# Patient Record
Sex: Female | Born: 1965 | Race: White | Marital: Married | Smoking: Never smoker
Health system: Western US, Academic
[De-identification: ages and names within clinical notes are randomized; demographics above are authoritative.]

## PROBLEM LIST (undated history)

## (undated) DIAGNOSIS — K3184 Gastroparesis: Secondary | ICD-10-CM

## (undated) HISTORY — DX: Gastroparesis: K31.84

---

## 1998-12-14 ENCOUNTER — Other Ambulatory Visit: Payer: Self-pay

## 2001-12-18 HISTORY — PX: PR LAPAROSCOPY SURG CHOLECYSTECTOMY: 47562

## 2007-09-18 HISTORY — PX: PR ESOPHAGOGASTRODUODENOSCOPY TRANSORAL DIAGNOSTIC: 43235

## 2009-02-04 ENCOUNTER — Ambulatory Visit

## 2009-02-05 ENCOUNTER — Ambulatory Visit: Admitting: Ophthalmology

## 2009-02-05 NOTE — Progress Notes (Signed)
ASSESSMENT:  1. Myopia/astigmatism. No evidence of corneal ectasia on topography. CCT higher than average OU.  2. Glaucoma suspect by CDR. No family hx available (adopted).     COMMENTS and DISCUSSION:    I have discussed the findings with patient.  Treatment options including surgical and non-surgical options were discussed with patients.  Risks and benefits of refractive surgery such as bleeding, infection, blindness, night vision problems such as halo and glare, loss of best corrected vision and scarring was discussed with patient.  Patient verbally expressed understanding.    Disussed need for reading glasses in early to mid-forties if both eyes corrected for distance.    PLAN:  1. Schedule  Baseline HVF 24-2 SITA-STD.  2. If normal HVF, schedule LASIK OU (pt will consider sequential vs bilateral).
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
## 2010-03-18 HISTORY — PX: PR SIGMOIDOSCOPY FLX DX W/COLLJ SPEC BR/WA IF PFRMD: 45330

## 2010-03-18 HISTORY — PX: PR ESOPHAGOGASTRODUODENOSCOPY TRANSORAL DIAGNOSTIC: 43235

## 2012-04-17 HISTORY — PX: PR ESOPHAGOGASTRODUODENOSCOPY TRANSORAL DIAGNOSTIC: 43235

## 2015-02-16 ENCOUNTER — Encounter: Payer: Self-pay | Admitting: OTHER M.D.

## 2015-02-16 ENCOUNTER — Ambulatory Visit: Payer: BLUE CROSS/BLUE SHIELD | Admitting: Nurse Practitioner

## 2015-02-16 ENCOUNTER — Encounter: Payer: Self-pay | Admitting: Nurse Practitioner

## 2015-02-16 VITALS — BP 112/76 | HR 72 | Temp 98.8°F | Ht 68.0 in | Wt 119.1 lb

## 2015-02-16 DIAGNOSIS — K3184 Gastroparesis: Secondary | ICD-10-CM | POA: Insufficient documentation

## 2015-02-16 DIAGNOSIS — R112 Nausea with vomiting, unspecified: Principal | ICD-10-CM

## 2015-02-16 DIAGNOSIS — K219 Gastro-esophageal reflux disease without esophagitis: Secondary | ICD-10-CM

## 2015-02-16 DIAGNOSIS — K921 Melena: Secondary | ICD-10-CM

## 2015-02-16 NOTE — Progress Notes (Signed)
GI Consultation note requested by: outside pcp    No chief complaint on file.    Patient arrived 15 minutes late for appnt    Subjective: Samantha Stokes is a 49yr old female who presents for gastroparesis.     No constipation/diarrhea/melena/abd pain    Nausea: Duration: 15 years . Frequency: daily, intermittent. Vomiting: no. Prior history of: no. Treatment: no.      GERD/ heartburn: Duration: 2 years . Frequency: episodic, daily for months, then nothing for months. Treatment: zantac 1-2 times a day. Dysphagia: no. Prior work-up: 2013 egd: per patient gastritis.     Hematochezia/BRBPR: Duration: 10 years. Frequency: few times a year . Where blood is seen: on paper. Associated symptoms: no. Treatment: no. Prior work-up: 2013 sigmoidoscopy : wnl per pt.    Dx with gastroparesis in 2002 via gastric emptying study. Symptoms have improved with time. Tried reglan, zelnorm, EES not effective. Manages with accupuncture, nutritionist, chiropracter. Currently eats "regular" food, just smaller meals.      Weight: stable  Appetite: varies  Prior endoscopy: see above  Transfusion history: no  IVDA: no  Blood donation history: no  Herbal/OTC medications not specified in medication list: mvi no fe, vit d, vit K2, diclyceride licorice, flax seed oil, primrose oil, co q 10, fish oil, digestive enzymes, bile, d-limonen  NSAIDS: rare  LMP: irregular, 02/09/2015      ROS:  Constitutional: negative.  Eyes: negative.  Ears, Nose, Mouth, Throat: negative.  CV: negative.  Resp: negative.  GU: negative.  Musculoskeletal: negative.  Integumentary: negative.  Neuro: negative.  Psych: Mood pt's report, euthymic.  Endo: negative.  Heme/Lymphatic: negative.  Allergy/Immun: negative.  GYN: negative.    History:  I reviewed the patient's past medical and family/social history, and updated that information that was contributory .    Objective:    General Appearance: healthy, alert, no distress, pleasant affect, cooperative.  Eyes:  conjunctivae and  corneas clear. PERRL  Mouth: normal.  Neck:  Neck supple. No adenopathy, thyroid symmetric, normal size.  Heart:  normal rate and regular rhythm, no murmurs, clicks, or gallops.  Lungs: clear to auscultation.  Abdomen:  Abdomen soft, non-tender.  No masses or organomegaly.  4 Extremities:  no cyanosis, clubbing, or edema.  Hydration: well hydrated.    Labs/imaging:  Performed at [facility], reviewed: see computer database.  Performed outside of [place], reviewed if applicable:     2992 egd: mild esophagitis, moderte gastritis; path: gastritis and ? esopahgitis    03/2010 sigmoidoscopy  :int hemorrhoids    03/2010 egd: gastritis (suspect bile gastritis); sb bx wnl; path: gastritis; gej bx: inflammation    04/2012 egd: gastritis, path: gastritis    Impression/recommendations:   49 y/o female in NAD here for evaluation of:    1.  Chronic intermittent n/v. Per patient dx with gastroparesis several years ago. Has tried reglan, EES, and zelnorm with no effect. Manages with accupuncture, nutritionist, chiropracter, "regular" food, just smaller meals.Will obtain gastric emptying study reports    2.  [name]. Patient does not want to take ppi. Plan bid h2 blocker.    3.  Chronic intermittent hematochezia, stable pattern.s/p sigmoidoscopy in 2011: hemorrhoids    4.  Gastritis on egd reports, 2011 suspicious for bile reflux. Per MD notes patient declined cholestyramine. Will see how patient responds to bid h2 blocker. Patient plans to get me most recent labs (cbc, ccp, tsh).    RTC: 6 week(s)    Visit length: >60 minutes  >50 %  of time spent counselling re: gastroparesis, gastritis, esophagitis/reflux, n/v    Patient barriers to learning: none    Pt/family understanding: verbalizes    Rockne Menghini, NP

## 2015-02-16 NOTE — Nursing Note (Signed)
Vital signs taken, verified allergies, pharmacy and screened for pain.  Samantha Stokes, MAII

## 2015-03-01 ENCOUNTER — Encounter: Payer: Self-pay | Admitting: Nurse Practitioner

## 2015-03-01 NOTE — Telephone Encounter (Signed)
From: Reginia Naas  To: Rockne Menghini, NP  Sent: 02/28/2015 5:34 PM PDT  Subject: Visit Follow-up Question    Hello Sharyn Lull,     At my visit on February 16, 2015, you had asked me to provide my recent lab results (which I have faxed to your office); the type of thyroid medication I am taking; and my heidelberg test results. Below is the information:    1. I am taking T3 108mcg 2x per day  2. Heidelberg Gastric pH analysis = mild lack of stomach acid. Here is a link to the [employer].   http://www.phcapsule.com

## 2015-03-03 NOTE — Progress Notes (Signed)
Outside record review: 2003 gastric emptying study: 1/2 time 175 minutes (normal <90). Did not improve after 10 mg reglan IV. Stasis in antrum specifically

## 2015-03-30 ENCOUNTER — Ambulatory Visit: Payer: Self-pay | Admitting: Nurse Practitioner

## 2015-04-20 ENCOUNTER — Encounter: Payer: Self-pay | Admitting: Nurse Practitioner

## 2015-06-01 ENCOUNTER — Encounter: Payer: Self-pay | Admitting: Nurse Practitioner

## 2015-07-21 ENCOUNTER — Ambulatory Visit: Payer: BLUE CROSS/BLUE SHIELD | Admitting: Dermatology

## 2015-07-21 ENCOUNTER — Encounter: Payer: Self-pay | Admitting: Dermatology

## 2015-07-21 VITALS — BP 102/68 | HR 62 | Temp 98.6°F

## 2015-07-21 DIAGNOSIS — L814 Other melanin hyperpigmentation: Secondary | ICD-10-CM

## 2015-07-21 DIAGNOSIS — I781 Nevus, non-neoplastic: Secondary | ICD-10-CM

## 2015-07-21 DIAGNOSIS — D485 Neoplasm of uncertain behavior of skin: Principal | ICD-10-CM

## 2015-07-21 DIAGNOSIS — L820 Inflamed seborrheic keratosis: Secondary | ICD-10-CM

## 2015-07-21 NOTE — Nursing Note (Signed)
Vitals, smoking history, allergies, medications, and pharmacy verified. Wendee Copp, MA 07/21/2015 2:09 PM

## 2015-07-21 NOTE — Progress Notes (Signed)
07/21/2015     Dear Dr. Patrina Levering,    I had the pleasure of seeing Samantha Stokes, a 49yr old female in dermatology clinic.  The patient's main concern is a sore spot on the forehead.  Please see the detailed HPI in the HPI/Exam/Assessment/Plan section below.    Other pertinent dermatology history: no personal history of skin cancer    Patient questionnaire dated 07/21/2015 was reviewed.  Past medical history, medications and allergies were also reviewed.    Past medical history: reviewed in EMR  Patient Active Problem List    Diagnosis Date Noted    Gastroparesis 02/16/2015     Family History:   There is no family history of skin cancer.  There is no family history of atopy or psoriasis.  Social History: no smoking or use of tobacco products; 1-2 alcoholic beverages per week  Occupation: Museum/gallery conservator  Medications: reviewed in EMR  Dextran 70-Hypromellose (SAV-ON ARTIFICIAL TEARS) OPHTH Drop, Instill  into the affected eye(s). PRN OU   Miscellaneous Medication, T3 52mcg bid  Ranitidine (ZANTAC) 150 mg Tablet, Take 1 tablet by mouth 2 times daily. (acid)    No current facility-administered medications for this visit.    Allergies: reviewed in EMR  Codeine and Hydrocodone-acetaminophen  ROS: The patient has been asked about fever, fatigue, weight loss, visual changes, wheezing, cough, hay fever/pollen allergy, headaches, sore throat, bleeding gums, joint or muscle pain, shortness of breath, chest pain, lymph node swelling, current infection, pain in calves, nausea, vomiting, diarrhea, burning with urination, depression/anxiety, numbness/tingling, or dizziness. Positives are: none.  All other ROS are negative.     Physical Examination:  There were no vitals taken for this visit.  A full body skin exam was done relating to moles/pigmented lesions, and skin photodamage.    On physical examination, the patient is well developed, well nourished, and pleasant with normal affect. The patient is alert to time, place, and  person.    The following areas were inspected:   Head/scalp including palpation, face, eyes/eyelids/conjunctiva, nose, mouth/oral mucosa/lips, neck, chest (including the breasts and axillae), abdomen, buttocks/groin, back, R and L upper extremities, R and L lower extremities.  Digits/nails were inspected and palpated (for clubbing, cyanosis, inflammation). The lower extremities were inspected for peripheral swelling and varicosities.      The findings were normal except for the following pertinent abnormal findings listed in the integrated HPI/Exam/Assessment/Plan section below.    Labs/X-rays/Path reviewed today: n/a      HPI/Exam/Assessment/Plan:  # Neoplasm of uncertain behavior of the skin:   A) mid forehead, r/o irritated seborrheic keratosis vs BCC  HPI: Pt c/o sore spot on the forehead.  First appeared about 1 month ago.  No bleeding or itching.  Has been growing in size.  PE:   - (A) mid forehead: 4 x 4 mm well-marginated, flesh-colored, papillated papule  Plan:   - Shave biopsy done for definitive diagnosis.  - PROCEDURE: SHAVE BIOPSY x 1  Informed Consent: Risks, benefits, and alternatives of procedure were discussed.  Risks include, but are not limited to, pain, bleeding, infection, scarring, worsening of condition, recurrence, failure to resolve condition, and need for additional procedures.  The patient expressed understanding of these risks and wished to proceed.  Consent form was signed.  Description of Procedure: The lesion was broadly cleansed with alcohol wipe.  The biopsy site was anesthetized with 1 cc of 1% lidocaine with 1:100,000 epinephrine.  The lesion was shaved in the dermal plane and  placed in formalin solution.  Hemostasis was obtained with hyfrecator/Drysol.  Vaseline and sterile bandage was applied following the procedure.  The patient was instructed to apply Vaseline to the biopsy site and cover it with a bandaid until completely healed.  The patient tolerated the procedure well  without any complications.  Post-procedure wound care instructions were reviewed.  The patient was instructed to contact us if persistent bleeding, increasing pain, swelling or redness occurs.  Patient's phone number was obtained for reporting of results. Permission was obtained from patient to leave message with results if phone is not answered.  Patient also provided permission verbally to give results on MyChart.     # Neoplasm of uncertain behavior of the skin:  B) right upper arm, r/o dysplastic nevus  C) left calf, r/o dysplastic nevus  HPI: No symptoms.  Duration: unknown.  PE:   - (B) right upper arm: 2 x 1 mm dark brown macule with irregular border  - (C) left calf: 2 x 1 mm dark brown macule with irregular border  Plan:   - Shave biopsy done for definitive diagnosis and removal.  - PROCEDURE: SHAVE REMOVAL x 2 [lesion size: (A) 2 mm, (B) 2 mm]  Informed Consent: Risks, benefits, and alternatives of procedure were discussed.  Risks include, but are not limited to, pain, bleeding, infection, scarring, worsening of condition, recurrence, failure to resolve condition, and need for additional procedures.  The patient expressed understanding of these risks and wished to proceed.  Consent form was signed.  Description of Procedure: The lesion was broadly cleansed with alcohol wipe.  Each biopsy site was anesthetized with 1 cc of 0.5% lidocaine with 1:200,000 epinephrine.  Each lesion was shaved in the dermal plane with 1 mm margin for complete removal, and placed in formalin solution.  Hemostasis was obtained with hyfrecator/Drysol.  Vaseline and sterile bandage was applied following the procedure.  The patient was instructed to apply Vaseline to the biopsy site and cover it with a bandaid until completely healed.  The patient tolerated the procedure well without any complications.  Post-procedure wound care instructions were reviewed.  The patient was instructed to contact us if persistent bleeding, increasing  pain, swelling or redness occurs.  Patient's phone number was obtained for reporting of results. Permission was obtained from patient to leave message with results if phone is not answered.    # Benign-appearing nevus of the left plantar foot  HPI: No symptoms.  Has been present for many years.  Has not noticed any change in size, shape or color.    PE:   - 0.3 x 0.4 cm uniformly dark brown, well-marginated, homogenously pigmented macule on left plantar foot  Plan:   - Lesion photographed today for future comparison and longitudinal monitoring.  - Counseled patient on ABCDEs of melanoma and sunscreen/sun protection.    Follow-Up: Advised patient to return in 1 year or sooner pending biopsy results.    Education needs were identified. There were no barriers to understanding and the patient's questions and concerns were addressed.  Thank you for allowing [place] to participate in the care of your patient.       Gilberto Better, MD  Attending, Associate Physician  Department of Dermatology  [place] [facility] [place], [place]

## 2015-07-21 NOTE — Patient Instructions (Signed)
Skin Biopsy    How do I take care of the biopsy site?    For a sutured site: leave the original dressing in place for 24 hours. After 24 hours, you may clean the area twice daily with mild soap or cleanser and water and pat dry. Apply a thin layer of Vaseline over the sutures and cover with a band - aid or a non - stick Telfa bandage as instructed. If the bandage becomes wet or soiled, remove it and clean the area as described above before applying a new bandage.    For a non - sutured site: leave the original dressing in place till the following day. If a small a small amount of drainage is encountered, follow the above procedure (i.e. sutured site care) until the area is smooth to the touch (usually less than one week.    If bleeding occurs:     Apply steady pressure for 15 minutes.   If bleeding persists, call your physician immediately.    Infection    It is common to have slight redness and swelling at the biopsy site. However, you should contact your physician immediately if any of the following signs occur:     Increased redness or swelling, drainage, pain or feeling of warmth at the biopsy sites.   Red streaks leading away from the wound   Fever, chills    How are the sutures removed?    Some sutures are "absorbable" and do not require removal. Most do require removal and the physician or nurse will do so and evaluate the wound healing process at 1 to 2 weeks following the biopsy.    How do I find out about my results?    Biopsy results can be discussed over the telephone or during a follow up visit. Please, before you leave the office, make sure you understand the arrangements that have been made to give you your results.    Biopsy results typically require 5 to 7 days after the biopsy to reach your dermatologist. If you haven't received your results after approximately two weeks, please call [phone].

## 2015-07-21 NOTE — Nursing Note (Signed)
Lidocaine 1 % with Epinephrine  Lot#: [id number]  Exp: 08/16/2015    Oley Balm, MA 07/21/2015 2:31 PM      Per patient okay to leave a message with biopsy results on home phone.

## 2015-07-27 LAB — DERMATOLOGY PATHOLOGY

## 2015-08-19 ENCOUNTER — Encounter: Payer: Self-pay | Admitting: Dermatology

## 2016-04-27 ENCOUNTER — Encounter: Payer: Self-pay | Admitting: Internal Medicine

## 2016-07-03 ENCOUNTER — Encounter: Payer: Self-pay | Admitting: Dermatology

## 2016-07-03 ENCOUNTER — Ambulatory Visit: Payer: BLUE CROSS/BLUE SHIELD | Admitting: Dermatology

## 2016-07-03 DIAGNOSIS — D1801 Hemangioma of skin and subcutaneous tissue: Secondary | ICD-10-CM

## 2016-07-03 DIAGNOSIS — I781 Nevus, non-neoplastic: Principal | ICD-10-CM

## 2016-07-03 DIAGNOSIS — L570 Actinic keratosis: Secondary | ICD-10-CM

## 2016-07-03 DIAGNOSIS — L821 Other seborrheic keratosis: Secondary | ICD-10-CM

## 2016-07-03 DIAGNOSIS — D235 Other benign neoplasm of skin of trunk: Secondary | ICD-10-CM

## 2016-07-03 DIAGNOSIS — D2362 Other benign neoplasm of skin of left upper limb, including shoulder: Secondary | ICD-10-CM

## 2016-07-03 DIAGNOSIS — D2372 Other benign neoplasm of skin of left lower limb, including hip: Secondary | ICD-10-CM

## 2016-07-03 DIAGNOSIS — D2371 Other benign neoplasm of skin of right lower limb, including hip: Secondary | ICD-10-CM

## 2016-07-03 DIAGNOSIS — D2361 Other benign neoplasm of skin of right upper limb, including shoulder: Secondary | ICD-10-CM

## 2016-07-03 NOTE — Nursing Note (Signed)
Allergies and smoking history reviewed, screened for pain, and pharmacy verified. Wendee Copp, [place] 07/03/2016 8:04 AM

## 2016-07-03 NOTE — Progress Notes (Addendum)
07/03/2016; last seen on 07/21/2015 by Dr. Lurena Joiner    I had the pleasure of seeing Samantha Stokes, a 50yr old female in dermatology clinic.      Chief complaint: lesions on the skin. Unsure if mole on the left foot has grown     Please see the detailed HPI in the HPI/Exam/Assessment/Plan section below.    Other pertinent dermatology history: no personal history of skin cancer    Patient questionnaire dated 07/03/2016 was reviewed.  Past medical history, medications and allergies were also reviewed.    Past medical history: reviewed in EMR  Patient Active Problem List    Diagnosis Date Noted    Gastroparesis 02/16/2015     Family History:   There is no family history of skin cancer.  There is no family history of atopy or psoriasis.  Social History: no smoking or use of tobacco products; 1-2 alcoholic beverages per week  Occupation: Museum/gallery conservator  Medications: reviewed in EMR  Dextran 70-Hypromellose (SAV-ON ARTIFICIAL TEARS) OPHTH Drop, Instill  into the affected eye(s). PRN OU   Miscellaneous Medication, T3 55mcg bid  Ranitidine (ZANTAC) 150 mg Tablet, Take 1 tablet by mouth 2 times daily. (acid)    Labs reviewed today 07/03/2016:    DIAGNOSIS     07/21/2015              A) SKIN, MID FOREHEAD, (SHAVE BIOPSY)       INFLAMED SEBORRHEIC KERATOSIS, WITH BOWENOID ATYPIA, SEE NOTE.        Note: The bowenoid features suggest the possibility of carcinoma in situ evolving    within this keratosis. The specimen was leveled.        The bowenoid changes extend to the lateral biopsy edges. If the lesion    persists/recurs, additional biopsy or removal may be worthwhile.        B) SKIN, RIGHT UPPER ARM, (SHAVE REMOVAL)       SOLAR LENTIGO, SEE NOTE.        C) SKIN, LEFT CALF, (SHAVE REMOVAL)       SOLAR LENTIGO, SEE NOTE.        B,C) Note: Deeper sections exhausting both specimens fail to reveal additional    diagnostic features.     No current facility-administered medications for this  visit.    Allergies: reviewed in EMR  Codeine and Hydrocodone-acetaminophen  ROS: The patient has been asked about fever, fatigue, weight loss, visual changes, wheezing, cough, hay fever/pollen allergy, headaches, sore throat, bleeding gums, joint or muscle pain, shortness of breath, chest pain, lymph node swelling, current infection, pain in calves, nausea, vomiting, diarrhea, burning with urination, depression/anxiety, numbness/tingling, or dizziness. Positives are: none.  All other ROS are negative.     Physical Examination:  There were no vitals taken for this visit.  A full body skin exam was done relating to moles/pigmented lesions, and skin photodamage.    On physical examination, the patient is well developed, well nourished, and pleasant with normal affect. The patient is alert to time, place, and person.    On physical examination, the patient is well developed, well nourished, and pleasant with normal affect. The patient is alert and active.  The following areas were inspected and found to be normal except as specified below:   Scalp including palpation, head/face, eyelids and conjunctiva, nose, mouth/oral mucosa, lips, neck (including palpation), chest (including the breasts and axillae), abdomen, buttocks/groin, back, R and L upper extremities, R and L lower extremities. Digits/nails were inspected  and palpated (for clubbing, cyanosis, inflammation). The lower extremities were inspected for peripheral swelling, varicosities. Lymph node basins were palpated in the neck.       Cherry angiomas:   HX: Pt reports bright red lesions on trunk and extremities. Present for months-years, asymptomatic. FH of similar. No prior treatment.   PE: Scattered brightly erythematous, vascular papules on the trunk and extremities.  AP: Benign nature of these skin lesions was explained to the patient. No treatment is indicated at this time. The patient has been educated about self examination, and if changes occur including  change in size, color or if lesions become symptomatic, the patient was instructed to return for follow up.     Actinic Keratosis:   HX: Non healing pink scaly lesion on left forearm, present for months, asymptomatic, no prior treatment  PE: Total of 1 erythematous, gritty patches and thin papules located on the left forearm  AP: After obtaining verbal consent, 1 lesion were treated with liquid nitrogen with a 10 second thaw time. The risks, including pain, blister formation, infection, hypo/hyperpigmentation, scar, recurrence, and the need for further treatment were discussed with the patient and informed consent was obtained verbally. The patient tolerated the treatment well and knows to return if the lesions do not resolve.     Seborrheic Keratoses:   HX: Patient reports brown waxy growths on arms and back that are intermittently itchy otherwise asymptomatic. Present for months. Seem to be growing in size and number. No prior treatment.   PE: Few Tan-brown, verrucous, stuck-on papules with pseudohorn like cysts on the extremities and the back  AP: Reassurance was given as to the benignity of the growths. No treatment necessary.    Benign-appearing nevus of the left plantar foot  HPI: No symptoms.  Has been present for many years. Unsure of any change in size, shape or color.    PE:  0.3 x 0.4 cm uniformly dark brown, well-marginated, homogenously pigmented macule on left plantar foot. No changes from prior exam 1 year prior - photos reviewed, measurement consistent  Plan:   - Lesion compared to photograph taken at last encounter. No changes  - Counseled patient on ABCDEs of melanoma and sunscreen/sun protection.    Benign-appearing nevi:   Hx: Pt reports multiple brown flat and fleshy lesions on the trunk and extremities. Present for years, asymptomatic, no prior treatment of any of these lesions.   PE: Multiple tan to brown macules and papules with even border and color and benign pigment network under  epiluminescence microscopy on the trunk, upper extremities, and lower extremities.   AP: Pt was encouraged to perform self-skin examination approximately once a month to observe any changes in these currently benign-appearing nevi. Discussed that concerning changes may include developing bumps or nodules, bleeding, itching, pain or changes in the shape and color; advised follow up in clinic with any changes. Sun protection education was reviewed with the patient, including using a sunscreen with broadspectrum (UVA/UVB) protection with SPF of at least 30 and above, with frequent re-application.     Follow-Up: Advised patient to return in 1 year or sooner as needed     Education needs were identified. There were no barriers to understanding and the patient's questions and concerns were addressed.    Thank you for allowing [place] to participate in the care of your patient.     -------------------------------------------------------------------------------------------------------------------  SCRIBE DISCLAIMER:  This note was scribed by Hurshel Keys, a trained medical scribe, in the  presence  of Julio Sicks, MD, MPH, MAS    PROVIDER DISCLAIMER:  This document serves as my personal record of services taken in my presence. It was created on 07/03/2016 on my behalf by Hurshel Keys, a trained medical scribe.    I have reviewed this document and agree that this note accurately reflects the history and exam findings, the patient care provided, and my medical decision making.     07/03/2016  Electronically signed by  Julio Sicks, MD, MPH, [place] Department of Dermatology  Associate Physician

## 2016-07-13 ENCOUNTER — Ambulatory Visit (INDEPENDENT_AMBULATORY_CARE_PROVIDER_SITE_OTHER): Payer: BLUE CROSS/BLUE SHIELD

## 2016-07-13 ENCOUNTER — Ambulatory Visit: Payer: BLUE CROSS/BLUE SHIELD | Attending: Nurse Practitioner | Admitting: Nurse Practitioner

## 2016-07-13 ENCOUNTER — Encounter: Payer: Self-pay | Admitting: Nurse Practitioner

## 2016-07-13 VITALS — BP 108/72 | HR 77 | Temp 98.2°F | Resp 12 | Ht 69.0 in | Wt 119.3 lb

## 2016-07-13 DIAGNOSIS — K602 Anal fissure, unspecified: Secondary | ICD-10-CM | POA: Insufficient documentation

## 2016-07-13 DIAGNOSIS — R140 Abdominal distension (gaseous): Principal | ICD-10-CM

## 2016-07-13 DIAGNOSIS — R197 Diarrhea, unspecified: Secondary | ICD-10-CM | POA: Insufficient documentation

## 2016-07-13 DIAGNOSIS — K219 Gastro-esophageal reflux disease without esophagitis: Secondary | ICD-10-CM | POA: Insufficient documentation

## 2016-07-13 LAB — THYROID STIMULATING HORMONE: THYROID STIMULATING HORMONE: 0.84 u[IU]/mL (ref 0.35–3.30)

## 2016-07-13 NOTE — Patient Instructions (Signed)
Get blood drawn    Turn in stool specimens    Stop the supplement for 2 weeks    Take zantac 2 times a day    Try dairy elimination diet for 2 weeks. If not effective, Drink 2 quarts of water a day, more fiber in diet, exercise, and take citrucel daily (may increase to 2 times a day after one week).     Get abdominal and pelvic ultrasound done    Sit in bath tub daily, use wet wipes and dab, keep stools soft    See me in 6 weeks

## 2016-07-13 NOTE — Nursing Note (Signed)
Vitals signs taken, screened for pain, verified allergies and verified pharmacy.  Ngina Royer Carreau MA II

## 2016-07-13 NOTE — Progress Notes (Signed)
No chief complaint on file.      Subjective: Samantha Stokes is a 50yr old female who presents for follow up of gastroparesis    02/2015 consult: 1.  Chronic intermittent n/v. Per patient dx with gastroparesis several years ago. Has tried reglan, EES, and zelnorm with no effect. Manages with accupuncture, nutritionist, chiropracter, "regular" food, just smaller meals.Will obtain gastric emptying study reports    2.  Samantha Stokes. Patient does not want to take ppi. Plan bid h2 blocker.    3.  Chronic intermittent hematochezia, stable pattern.s/p sigmoidoscopy in 2011: hemorrhoids    4.  Gastritis on egd reports, 2011 suspicious for bile reflux. Per MD notes patient declined cholestyramine. Will see how patient responds to bid h2 blocker. Patient plans to get me most recent labs (cbc, ccp, tsh).    Outside record review: 2003 gastric emptying study: 1/2 time 175 minutes (normal <90). Did not improve after 10 mg reglan IV. Stasis in antrum specifically    Gerd, stable: episodic, lasts for months, then nothing for months; has been having daily for 4 months. Zantac daily.     Diffuse abd bloating for 2 months. No increased flatulence. No constipation    [place] 5 stools for one year, daily, 1 bm/day, no travel    No diet or medication changes. New onset of perimenopausal symptoms: hot flashes. Admits to increased stress    External skin tag?    History:  I reviewed the patient's past medical and family/social history, and updated that information that was contributory .    Physical exam:  General Appearance: healthy, alert, no distress, pleasant affect, cooperative.  Abdomen:   Abdomen soft, non-tender.  No masses or organomegaly.  Rectal: external exam only, posterior anal fissure, skin tag, ext hemorrhoids.    Labs/imaging performed at [facility] reviewed: see EMR database    Impression/recommendations:  50 y/o female in NAD here for f/u of :    1.  Gerd, poorly controlled. Plan zantac bid    2.  Chronic intermittent bloating. Plan  dairy elim diet, abd/pelvic ultrasound, [place] 125, fluid/fiber/exercise/fiber supp    3.  Chronic intermittent loose stools as described above. Plan tsh, stool for infection, stop supplement started about time diarrhea began, fiber supp, dairy elim diet    4.  Posterior anal fissure, asymptomatic. Discussed options, plan sitz bath/keep stools soft/wet wipes/dab clean    RTC: 6 week(s) and prn    Visit length: 45 minutes  >50 % of time spent counselling re: gerd, bloating, loose stools, anal fissure    Patient barriers to learning: none    Pt/family understanding: verbalizes    Rockne Menghini, NP

## 2016-07-14 LAB — CA 125: CA 125: 5.6 U/mL (ref <=35.0)

## 2016-07-18 ENCOUNTER — Ambulatory Visit: Payer: BLUE CROSS/BLUE SHIELD | Attending: Nurse Practitioner

## 2016-07-18 DIAGNOSIS — R197 Diarrhea, unspecified: Principal | ICD-10-CM | POA: Insufficient documentation

## 2016-07-19 LAB — CRYPTOSPORIDIUM/GIARDIA FA

## 2016-07-20 ENCOUNTER — Ambulatory Visit (INDEPENDENT_AMBULATORY_CARE_PROVIDER_SITE_OTHER)
Admission: RE | Admit: 2016-07-20 | Discharge: 2016-07-20 | Disposition: A | Discharge status: Home / Self Care | Payer: BLUE CROSS/BLUE SHIELD | Source: Ambulatory Visit

## 2016-07-20 ENCOUNTER — Ambulatory Visit
Admission: RE | Admit: 2016-07-20 | Discharge: 2016-07-20 | Disposition: A | Discharge status: Home / Self Care | Payer: BLUE CROSS/BLUE SHIELD | Source: Ambulatory Visit

## 2016-07-20 DIAGNOSIS — R140 Abdominal distension (gaseous): Principal | ICD-10-CM

## 2016-07-21 LAB — CULTURE GASTROINTESTINAL, BACTI (INCLUDES SHIGA-TOXIN)

## 2016-08-25 ENCOUNTER — Ambulatory Visit: Payer: Self-pay | Admitting: Nurse Practitioner

## 2016-10-05 ENCOUNTER — Ambulatory Visit: Payer: BLUE CROSS/BLUE SHIELD | Admitting: Nurse Practitioner

## 2016-10-05 VITALS — BP 118/71 | HR 70 | Temp 98.0°F | Resp 16 | Wt 125.0 lb

## 2016-10-05 DIAGNOSIS — R140 Abdominal distension (gaseous): Principal | ICD-10-CM

## 2016-10-05 DIAGNOSIS — K219 Gastro-esophageal reflux disease without esophagitis: Secondary | ICD-10-CM

## 2016-10-05 DIAGNOSIS — K3184 Gastroparesis: Secondary | ICD-10-CM

## 2016-10-05 NOTE — Progress Notes (Signed)
No chief complaint on file.      Subjective: Mollee Aguas is a 50yr old female who presents for follow up of 06/2016 visit:    1.  [name], poorly controlled. Plan zantac bid    2.  Chronic intermittent bloating. Plan dairy elim diet, abd/pelvic ultrasound, [place] 125, fluid/fiber/exercise/fiber supp    3.  Chronic intermittent loose stools as described above. Plan tsh, stool for infection, stop supplement started about time diarrhea began, fiber supp, dairy elim diet    4.  Posterior anal fissure, asymptomatic. Discussed options, plan sitz bath/keep stools soft/wet wipes/dab clean    CA125 wnl    Crypto, culture negative     Tsh wnl    07/2016 abd [place] : 1. CHOLECYSTECTOMY. NO DUCTAL DILATATION.    Pelvic ultrasound : 1. UNREMARKABLE UTERUS AND BILATERAL OVARIES.    2. TRACE SIMPLE PELVIC FREE FLUID WHICH IS WITHIN PHYSIOLOGIC LIMITS IN  PREMENOPAUSAL PATIENT.    [name], well controlled with bid zantac    Chronic intermittent bloating, stable (every other day) . Did not do dairy elim diet. Taking fiber supp    Loose stools, resolved after stopping supplement    History:  I reviewed the patient's past medical and family/social history, and updated that information that was contributory .    Physical exam:  General Appearance: healthy, alert, no distress, pleasant affect, cooperative.    Labs/imaging performed at [facility] reviewed: see EMR database    Impression/recommendations:  50 y/o female in NAD here for f/u of :    1.  Gerd, well controlled with bid zantac    2.  Chronic intermittent bloating, stable. Abd/pelvic ultrasound, [place] 125, fiber supp not effective. Reinforced dairy elim diet, h2 breath test. Consider FODMAP diet and/or colonoscopy, declined for now    3.  Chronic intermittent loose stools, resolved after stopping supplement. Infection, thyroid dysfunction ruled out.     4.  gastroparesis, well controlled with diet.     RTC: 6 week(s) and prn    Visit length: 35 minutes  >50 % of time spent counselling re: gerd,  bloating, loose stools, gastroparesis    Patient barriers to learning: none    Pt/family understanding: verbalizes    Rockne Menghini, NP

## 2016-10-05 NOTE — Nursing Note (Signed)
Vital signs taken, allergies verified, screened for pain, pharmacy verified Tara L Shaver, MA

## 2016-10-05 NOTE — Patient Instructions (Signed)
Dairy elimination diet for 2 weeks    You will be called within the next 10 days to schedule your hydrogen breath test.  If you do not receive a call within 10 days please call [phone].     Consider FODMAP diet and/or colonoscopy     See me in 6 weeks

## 2016-11-23 ENCOUNTER — Ambulatory Visit: Payer: Self-pay | Admitting: Nurse Practitioner

## 2016-11-24 ENCOUNTER — Ambulatory Visit: Payer: BLUE CROSS/BLUE SHIELD | Attending: GASTROENTEROLOGY

## 2016-11-24 ENCOUNTER — Encounter: Payer: Self-pay | Admitting: Nurse Practitioner

## 2016-11-24 DIAGNOSIS — R197 Diarrhea, unspecified: Principal | ICD-10-CM | POA: Insufficient documentation

## 2016-11-24 DIAGNOSIS — R140 Abdominal distension (gaseous): Secondary | ICD-10-CM | POA: Insufficient documentation

## 2016-11-24 NOTE — Nursing Note (Addendum)
Calibration: H2PPM: 159          CH4PPM:   74  CO2:  6.0  Blood sugar glucose:  77  Type of solution given:  Glucose       Sample Clock time PpmH2 PpmCH4 CO2 Corr.   Base line  14 8 3.6 1.52   1 89min 10 8 3.4 1.61   2 48min 7 9 3.1 1.77   3 58min 9 9 3.2 1.71   4 14min 5 7 3.2 1.71   5 162min 5 7 3.2 1.71   6 169min 5 7 3.2 1.71       Patient was NPO.  No smoking the day of the test, no antibiotics for 2 weeks, rinse with Befresh mouth wash, patient took breaths per routine of hydrogen breath test.            Hetty Blend,  MA

## 2016-11-24 NOTE — Nursing Note (Signed)
Reviewed orders for Hydrogen Breath Test.  Instructed patient on potential side effects of medication: bloating, abdominal cramping or pain, nausea, vomiting, or diarrhea. Patient verbalized understanding and agreeable to proceed with test. Administered: Glucose 75 grams (Glucocrush) PO. Instructed pt to drink over 20 minutes. Sharlot Gowda, RN

## 2016-11-28 HISTORY — PX: HC BREATH HYDROGEN TST: 750000047

## 2016-11-28 NOTE — Procedures (Signed)
Patient:  Samantha Stokes  LOCATION: [facility]  MR #: [id number]  SEX: female  AGE: 17yr  DOB: 1966-10-12  Operation Date: 11/24/2016,     Preoperative diagnoses: diarrhea, bloating and abdominal pain    OPERATION RECORD    ATTENDING PHYSICIAN PERFORMING THE PROCEDURE: Larrie Kass. I was physically present during the key portions(s) of the procedure. Key portion(s) include: interpretation.    ASSISTANT PHYSICIAN PERFORMING THE PROCEDURE: None    PROCEDURE: Hydrogen Breath Test    Procedure performed at [facility] . The patient followed instructions to avoid antibiotics and smoking two weeks prior to the procedure. The day of the procedure the patient arrived fasting. Baseline determinations of the hydrogen and methane were performed. Following the adminstration of 100 grams of glucose measurement of the hydrogen and methane performed at 30, 60, 90 and 120 minutes.    Estimated blood loss: none     Complications: none    Specimens: none    RESULTS:    Calibration: H2PPM: 159          CH4PPM:   74  CO2:  6.0  Blood sugar glucose:  77  Type of solution given:  Glucose       Sample Clock time PpmH2 PpmCH4 CO2 Corr.   Base line  14 8 3.6 1.52   1 22min 10 8 3.4 1.61   2 32min 7 9 3.1 1.77   3 27min 9 9 3.2 1.71   4 74min 5 7 3.2 1.71   5 181min 5 7 3.2 1.71   6 123min 5 7 3.2 1.71       Patient was NPO.  No smoking the day of the test, no antibiotics for 2 weeks, rinse with Befresh mouth wash, patient took breaths per routine of hydrogen breath test.              INTERPRETATION:    Hydrogen levels: Negative - The results are consistent with no evidence of bacterial overgrowth. Significant elevation is defined as 20 parts per million above the baseline.    Methane levels: Positive - The results of the methane are consistent with significant increase, this could represent constipation or high fiber diet.    RECOMMENDATIONS:  follow up in GI clinic       Larrie Kass, MD  Attending Physician

## 2016-12-07 ENCOUNTER — Encounter: Payer: Self-pay | Admitting: Nurse Practitioner

## 2016-12-07 ENCOUNTER — Ambulatory Visit: Payer: BLUE CROSS/BLUE SHIELD | Admitting: Nurse Practitioner

## 2016-12-07 VITALS — BP 123/74 | HR 71 | Temp 98.4°F | Ht 69.0 in | Wt 127.2 lb

## 2016-12-07 DIAGNOSIS — R195 Other fecal abnormalities: Secondary | ICD-10-CM

## 2016-12-07 DIAGNOSIS — R140 Abdominal distension (gaseous): Secondary | ICD-10-CM

## 2016-12-07 DIAGNOSIS — K219 Gastro-esophageal reflux disease without esophagitis: Principal | ICD-10-CM

## 2016-12-07 DIAGNOSIS — K3184 Gastroparesis: Secondary | ICD-10-CM

## 2016-12-07 MED ORDER — HYOSCYAMINE 0.125 MG SUBLINGUAL TABLET
0.1250 mg | SUBLINGUAL_TABLET | SUBLINGUAL | 1 refills | Status: DC | PRN
Start: 2016-12-07 — End: 2018-06-19

## 2016-12-07 MED ORDER — ONDANSETRON 4 MG DISINTEGRATING TABLET
4.0000 mg | DISINTEGRATING_TABLET | Freq: Three times a day (TID) | ORAL | 0 refills | Status: AC | PRN
Start: 2016-12-07 — End: 2017-12-02

## 2016-12-07 NOTE — Patient Instructions (Signed)
Try dairy elimination for 2 weeks. If not effective, consider FODMAP diet    Try levsin for bloating and/or loose stools    Follow up as needed

## 2016-12-07 NOTE — Nursing Note (Signed)
Vitals signs taken, screened for pain, verified allergies and verified pharmacy.  Tylie Golonka Carreau MA II

## 2016-12-07 NOTE — Progress Notes (Signed)
Chief Complaint   Patient presents with    Other     f/u       Subjective: Samantha Stokes is a 50yr old female who presents for follow up of 09/2016 visit:    Partner present    1. Gerd, well controlled with bid zantac    2. Chronic intermittent bloating, stable. Abd/pelvic ultrasound, [place] 125, fiber supp not effective. Reinforced dairy elim diet, h2 breath test. Consider FODMAP diet and/or colonoscopy, declined for now    3. Chronic intermittent loose stools, resolved after stopping supplement. Infection, thyroid dysfunction ruled out.     4. gastroparesis, well controlled with diet.     11/2016 h2 breath test negative     [name], 2 times a week when off daily zantac    Chronic intermittent bloating, stable (every other day). Did not try dairy elim diet.     History:  I reviewed the patient's past medical and family/social history, and updated that information that was contributory .    Physical exam:  General Appearance: healthy, alert, no distress, pleasant affect, cooperative.    Labs/imaging performed at [facility] reviewed: see EMR database    Impression/recommendations:  50 y/o female in NAD here for f/u of:    1. Gerd, 2 times a week when off zantac. Discussed antacid prn, daily zantac, lifestyle modification (specifically decrease ETOH)    2. Chronic intermittent bloating, stable. Abd/pelvic ultrasound 125, h2 breath test negative. fiber supp not effective. Reinforced dairy elim diet, if not effective consider FODMAP.  colonoscopy, declined for now. May consider levsin.     3. Chronic intermittent loose stools, resolved after stopping supplement except for around menses. Infection, thyroid dysfunction ruled out. May consider levsin    4. gastroparesis, well controlled with diet.     RTC:  prn    Visit length: 30 minutes  >50 % of time spent counselling re: gerd, bloating, loose stools, gastroparesis    Patient barriers to learning: none    Pt/family understanding: verbalizes    Rockne Menghini, NP

## 2017-04-29 ENCOUNTER — Encounter: Payer: Self-pay | Admitting: Dermatology

## 2017-04-29 DIAGNOSIS — L821 Other seborrheic keratosis: Secondary | ICD-10-CM | POA: Insufficient documentation

## 2017-04-29 DIAGNOSIS — L570 Actinic keratosis: Secondary | ICD-10-CM | POA: Insufficient documentation

## 2017-04-30 ENCOUNTER — Encounter: Payer: Self-pay | Admitting: Dermatology

## 2017-04-30 ENCOUNTER — Ambulatory Visit: Payer: BLUE CROSS/BLUE SHIELD | Admitting: Dermatology

## 2017-04-30 DIAGNOSIS — D1801 Hemangioma of skin and subcutaneous tissue: Secondary | ICD-10-CM

## 2017-04-30 DIAGNOSIS — L814 Other melanin hyperpigmentation: Secondary | ICD-10-CM

## 2017-04-30 DIAGNOSIS — L309 Dermatitis, unspecified: Secondary | ICD-10-CM

## 2017-04-30 DIAGNOSIS — D229 Melanocytic nevi, unspecified: Principal | ICD-10-CM

## 2017-04-30 DIAGNOSIS — D2372 Other benign neoplasm of skin of left lower limb, including hip: Secondary | ICD-10-CM

## 2017-04-30 DIAGNOSIS — I781 Nevus, non-neoplastic: Secondary | ICD-10-CM

## 2017-04-30 DIAGNOSIS — D309 Benign neoplasm of urinary organ, unspecified: Secondary | ICD-10-CM

## 2017-04-30 DIAGNOSIS — L820 Inflamed seborrheic keratosis: Secondary | ICD-10-CM

## 2017-04-30 DIAGNOSIS — L989 Disorder of the skin and subcutaneous tissue, unspecified: Secondary | ICD-10-CM

## 2017-04-30 MED ORDER — FLUOCINONIDE 0.05 % TOPICAL CREAM
TOPICAL_CREAM | TOPICAL | 0 refills | Status: AC
Start: 2017-04-30 — End: 2018-04-30

## 2017-04-30 NOTE — Nursing Note (Signed)
No Vital Signs taken today, allergies verified, screened for pain, med hx taken. No acute distress noted at this time. L. Desman Polak LVN

## 2017-04-30 NOTE — Progress Notes (Signed)
04/30/2017     Follow up visit for Samantha Stokes (DOB March 16, 1966), a 51yr old female, last seen 07/03/2017 by Dr. Josiah Lobo, previously Dr. Kaylyn Layer patient.    Chief complaint:  Itchy and irritated lesions on the back.     FBSE done today.       Inflamed and irritated seborrheic keratoses   Hx: The patient reports itchy lesions on the back and buttock area. Very bothersome.  Exam:  Tan verrucous surfaced flattop stuck-on papules with keratin dells located on the left mid back x 2, right buttock.  Has other similar lesions elsewhere which are not bothersome.  Assessment:   Seborrheic keratoses, inflamed and irritated   Plan:  Observe most lesions.    -- LN 2  x 2, left mid back, right buttock, to inflamed and irritated seborrheic keratoses, done today.      Possible irritant dermatitis  Hx: The patient reports a red area on the nose that seems to be irritated by her glasses present x many years. Had been noted with Dr Kaylyn Layer in the past (2015, felt to be seb dermatitis at that time).  Denies crusting, bleeding to the affected area. Has tried to switch glasses, which does seem to decrease symptoms occasionally, but never completely resolved.   Exam:  Poorly defined approximately 3-4 mm erythematous macule located on the left nose bridge, unremarkable and nonspecific dermatoscopic exam .   Assessment: possible irritant dermatitis.  Ddx includes actinic keratosis, BCC .  Plan:  Continue to monitor.   -- consider biopsy if any change.    Cherry angiomas  Hx: The patient has a history of red lesions on the chest and stomach area for years duration. None are bothersome.   Exam:  small bright red dome-shaped papules located on the chest, abdomen area and back.  Assessment: Cherry angiomas.  Benign nature discussed  Plan:   The patient was informed that these are benign lesions and no treatment is necessary.     Dermatitis  Hx:  The patient reports a recurrent itchy and red spot on the right foot. No prior treatment.    Exam: Ill-defined slightly pink patch with eczematization on the right dorsal foot.   Assessment:  Dermatitis.  Plan:  Discussed treatment options at length with patient.  -- [place] emollients, dry skin care.  -- Start Fluocinonide (LIDEX) 0.05 % Cream; Apply twice a day to the itchy rash on body as needed . DO NOT USE ON FACE.  Dispense: 30 g; Refill: 0     Solar lentigines    Hx:  The patient has a history of pigmented lesions on the face , developing more  Exam:  Multiple tan to brown well demarcated variably sized macules and patches on face. None are suspicious.  Assessment:   Benign sun related lesions  Plan:  Observe.    -- Cosmetic treatment options discussed: Retin-A, OTC retinol.    -- Recommend OTC retinol products.   -- Recommend Vitamin D supplements if patient does not have much sun exposure.  -- discussion about sunscreens and every day usage especially on face.  -- Continue observation, photoprotection, sunscreens, photoprotective clothing.     Nevi, benign   Hx: The patient has a history of pigmented lesions. Has a lesion on the left foot that has been present for many years. Denies significant changes. Denies significant symptoms. Patient has sun exposure playing golf, does wear a hat and apply sunscreen on the body. Does not apply sunscreen on the face-.  Exam: 3 mm x 3 mm brown, well-demarcated macule located on the left plantar foot- compared to photograph from 07/21/2015. Other brown evenly-pigmented well-demarcated variably-sized macules and papules. Nothing suspicious under dermatoscope.  Assessment:  Nevi, benign  Plan:  Observe.  Continue observation, photoprotection, sunscreens, photoprotective clothing.  -- Discussed possible biopsy/removal of left plantar foot lesion in the future .          Return Visit: 1 year for FBSE.           Problem List:   Patient Active Problem List    Diagnosis Date Noted    Actinic keratosis 04/29/2017      Priority: Low    Seborrheic keratoses 04/29/2017     Priority: Low    Gastroparesis 02/16/2015        Meds:    Per MA/LVN:  Outpatient Prescriptions Marked as Taking for the 04/30/17 encounter (Office Visit) with [name], MD   Medication Sig Dispense Refill    Fluocinonide (LIDEX) 0.05 % Cream Apply twice a day to the itchy rash on body as needed . DO NOT USE ON FACE. 30 g 0    Ranitidine (ZANTAC) 150 mg Tablet Take 1 tablet by mouth if needed. (acid) 60 tablet 1        Current Outpatient Prescriptions:     Hydrocortisone (CORTEF) 5 mg Tablet, Take 5 mg by mouth. BID, Disp: , Rfl:     Hyoscyamine (LEVSIN/SL) 0.125 mg Sublingual Tablet, Dissolve 1 tablet under the tongue every 4 hours if needed., Disp: 30 tablet, Rfl: 1    Ondansetron (ZOFRAN ODT) 4 mg disintegrating tablet, Take 1 tablet by mouth every 8 hours if needed., Disp: 9 tablet, Rfl: 0    Ranitidine (ZANTAC) 150 mg Tablet, Take 1 tablet by mouth if needed. (acid), Disp: 60 tablet, Rfl: 1    [employer], Bulk, (HRT CREAM BASE WOMEN) Cream, 1 g every day., Disp: , Rfl:     Allergies:     Codeine    Nausea/Vomiting  Hydrocodone-Acetaminophen    Nausea/Vomiting    Review of Systems:    Patient questionaire was reviewed which asked about:    Persistent fevers, unintentional weight loss, abdominal pain, headaches, persistent cough, pacemaker or other implants, current pregnancy, new health problems other than chief complaint. Positive findings: none. No new health problems per patient.        Past medical history:  Past medical history negative for melanoma or other skin cancer.    Family history:   Family history unknown for melanoma or other skin cancer- patient was adopted.    Physical Examination:  There were no vitals taken for this visit.  General. Healthy, well-developed, well nourished, no distress.  Neuro: Alert/oriented x 3.   Psych: Normal mood/affect.     Skin: The following areas were inspected and found to be normal (except  as specified in the note):   Scalp, face, eyelids, lips, neck, chest including breasts, abdomen, back, right arm, left arm, right leg, left leg, buttocks, digit/nails.     Labs reviewed today: No new labs pertinent to dermatology to review today.       Education needs were addressed and there were no barriers to learning.     SCRIBE DISCLAIMER:   This note was scribed by Tomasa Hose, a trained medical scribe, in the presence of [name], MD  Electronically signed by Tomasa Hose, SCRIBE, 04/30/2017  1:36 PM     PROVIDER DISCLAIMER:  This  document serves as my personal record of services that I personally performed and was taken in my presence. It was created on 05/01/2017 on my behalf by the medical scribe noted above.     I have reviewed this document and agree that this note accurately and completely reflects the history and exam findings, the patient care provided, and my medical decision making.    Electronically signed by:    [name], MD  [employer]   Department of Dermatology

## 2017-04-30 NOTE — Patient Instructions (Addendum)
Follow up: 1 year for full body skin exam.     ===========================================  Liquid Nitrogen Aftercare Instructions  After your treatment:    1) The area may be red for a day or so.  2) A blister or crust will form in several days.  3) You can wash with cleanser and water as you always do (including showers/baths as usual)     4) You may apply some clean Vaseline/Aquaphor (or antibiotic ointment such as Polysporin, bacitracin, etc., if you know you are not allergic to it), but this is not necessary.      5) You do not need to cover the area with a bandage unless you are temporarily covering it to avoid dirt (eg., while gardening) or trauma. Similarly, makeup/lotions/sunscreens should should be avoided on the area as much as possible until healed.  6) The wounds will generally heal in 7-10 days on face, and 10-14 days elsewhere, leaving a pink mark which fades with time. Sometimes a [place] mark will persist.  7) Infection is rare but please call with any questions: [phone].     ================================================  For your face:     -- OTC Retinol products, such as Neutrogena or RoC.  -- Recommend applying zinc oxide based sunscreens. Can also find powder sunscreens.  -- Continue photoprotection, sunscreens, photoprotective clothing.   -- Continue Vitamin D supplements.

## 2017-05-01 ENCOUNTER — Encounter: Payer: Self-pay | Admitting: Dermatology

## 2017-08-24 ENCOUNTER — Ambulatory Visit: Payer: BLUE CROSS/BLUE SHIELD | Admitting: Dermatology

## 2017-08-24 ENCOUNTER — Encounter: Payer: Self-pay | Admitting: Dermatology

## 2017-08-24 DIAGNOSIS — L820 Inflamed seborrheic keratosis: Principal | ICD-10-CM

## 2017-08-24 NOTE — Patient Instructions (Signed)
Liquid Nitrogen Aftercare Instructions      After your treatment:    1) The area may be red for a day or so.  2) A blister or crust will form in several days.  3) You can wash with cleanser and water as you always do (including showers/baths as usual)     4) You may apply some clean Vaseline/Aquaphor (or antibiotic ointment such as Polysporin, bacitracin, etc., if you know you are not allergic to it), but this is not necessary.      5) You do not need to cover the area with a bandage unless you are temporarily covering it to avoid dirt (eg., while gardening) or trauma. Similarly, makeup/lotions/sunscreens should should be avoided on the area as much as possible until healed.  6) The wounds will generally heal in 7-10 days on face, and 10-14 days elsewhere, leaving a pink mark which fades with time. Sometimes a [place] mark will persist.  7) Infection is rare but please call with any questions. [phone]

## 2017-08-24 NOTE — Progress Notes (Signed)
08/24/2017     Follow up visit for Samantha Stokes (DOB June 25, 1966), a 51yr old female, last seen 04/30/2017.      Chief complaint: lesions on back and buttocks.    Persistence of Inflamed and irritated seborrheic keratoses   Hx: The patient reports itchy lesions on the back and buttock area noted last visit 04/30/2017, treated for probable ISKs. The patient returns today. Notes they are still present and are still very bothersome. Past treatment: cryotherapy, but did not resolve after last visit.   Exam:  Tan to pink verrucous surfaced flattop stuck-on papules and plaques with keratin dells located on the left mid back (thicker there) and right buttock (flatter) x 2.  Has other similar lesions elsewhere which are not bothersome.  Assessment:   Seborrheic keratoses, inflamed and irritated.  Ddx for left back includes squamous cell carcinoma, for right buttock includes squamous cell carcinoma in situ but not typical.     Plan:  Observe most lesions.  -- discussed options, repeat liquid nitrogen versus shave removal.  Patient elects to try liquid nitrogen once more.  -- verbal consent obtained.  LN 2  x 2, left mid back and right buttock, to inflamed and irritated seborrheic keratoses, done today.  ln2 care handout given.  -- Band aid applied today as patient thinks her bra rubs on the area. Will remove bandage and bra when she gets home.     Return Visit: 2-3 months or sooner as needed for recheck---consider biopsy if not resolved .       Problem List:   Patient Active Problem List    Diagnosis Date Noted    Actinic keratosis 04/29/2017     Priority: Low    Seborrheic keratoses 04/29/2017     Priority: Low    Gastroparesis 02/16/2015        Meds:   Per MA/LVN:  Outpatient Prescriptions Marked as Taking for the 08/24/17 encounter (Office Visit) with [name], MD   Medication Sig Dispense Refill    Fluocinonide (LIDEX) 0.05 % Cream Apply twice a day to the itchy rash  on body as needed . DO NOT USE ON FACE. 30 g 0    Hydrocortisone (CORTEF) 5 mg Tablet Take 5 mg by mouth. BID      Ondansetron (ZOFRAN ODT) 4 mg disintegrating tablet Take 1 tablet by mouth every 8 hours if needed. 9 tablet 0    Ranitidine (ZANTAC) 150 mg Tablet Take 1 tablet by mouth if needed. (acid) 60 tablet 1    [employer], Bulk, (HRT CREAM BASE WOMEN) Cream 1 g every day.         Allergies:     Codeine    Nausea/Vomiting  Hydrocodone-Acetaminophen    Nausea/Vomiting    Review of Systems:    Patient questionaire was reviewed which asked about:    The patient has been asked about current: persistent fevers, night sweats, significant decrease in energy, unintentional weight loss, weakness, dizziness, mouth sores, bleeding gums, visual changes, persistent cough, wheezing, shortness of breath, sinus problems, chest pain, abdominal pain, nausea, vomiting, diarrhea, joint pain, muscle pain, headaches, pain with urination, heat intolerance, cold intolerance, anxiety, depression, excessive bleeding.   Positives are: none. All other ROS are negative.      Past medical history:  Past medical history negative for melanoma or other skin cancer.     Family medical history:  Family history negative for melanoma or other skin cancer.      Social  history:  The patient does not smoke or use tobacco products.  The patient does consume alcohol (5 drinks per week).    Physical Examination:  There were no vitals taken for this visit.  General. Healthy, well-developed, well nourished, no distress   Neuro: alert/oriented x 3.   Psych: Normal mood/affect.     Skin: The following areas were inspected and found to be normal (except as specified in the note):   Exam limited to face, back, and buttocks. Declines rest of skin exam today.     Last FBSE done 04/30/2017.    Labs reviewed today: No new labs pertinent to dermatology to review today.     Education needs were addressed and there were no barriers to learning.      -------------------------------------------------------------------------------------------------------------------  SCRIBE DISCLAIMER:  I, Maryan Char, SCRIBE,  am personally taking down the notes in the presence of [name], MD.   Electronically signed by Maryan Char, SCRIBE,  08/24/2017  10:48 AM    PROVIDER DISCLAIMER:  This document serves as my personal record of services that I personally performed and was taken in my presence. It was created on 08/24/2017 on my behalf by the medical scribe noted above.     I have reviewed this document and agree that this note accurately and completely reflects the history and exam findings, the patient care provided, and my medical decision making.    Electronically signed by:    [name], MD  [employer] [facility] [employer] [facility]   Department of Dermatology

## 2017-08-24 NOTE — Nursing Note (Signed)
Two patient identifiers verified, Patient roomed, chief complaint noted, allergies verified, screened for pain, and pharmacy verified. No vitals take per MD.  Samantha Quant, MA II

## 2017-11-23 ENCOUNTER — Ambulatory Visit: Payer: BLUE CROSS/BLUE SHIELD | Admitting: Dermatology

## 2018-04-26 DIAGNOSIS — E039 Hypothyroidism, unspecified: Secondary | ICD-10-CM | POA: Insufficient documentation

## 2018-06-04 ENCOUNTER — Ambulatory Visit: Payer: BLUE CROSS/BLUE SHIELD | Admitting: Physician Assistant

## 2018-06-04 ENCOUNTER — Encounter: Payer: Self-pay | Admitting: Physician Assistant

## 2018-06-04 DIAGNOSIS — L570 Actinic keratosis: Secondary | ICD-10-CM

## 2018-06-04 DIAGNOSIS — D235 Other benign neoplasm of skin of trunk: Secondary | ICD-10-CM

## 2018-06-04 DIAGNOSIS — D2360 Other benign neoplasm of skin of unspecified upper limb, including shoulder: Secondary | ICD-10-CM

## 2018-06-04 DIAGNOSIS — L814 Other melanin hyperpigmentation: Secondary | ICD-10-CM

## 2018-06-04 DIAGNOSIS — D1801 Hemangioma of skin and subcutaneous tissue: Secondary | ICD-10-CM

## 2018-06-04 DIAGNOSIS — I781 Nevus, non-neoplastic: Secondary | ICD-10-CM

## 2018-06-04 DIAGNOSIS — D2372 Other benign neoplasm of skin of left lower limb, including hip: Secondary | ICD-10-CM

## 2018-06-04 DIAGNOSIS — D229 Melanocytic nevi, unspecified: Principal | ICD-10-CM

## 2018-06-04 NOTE — Progress Notes (Signed)
06/04/2018; last seen on 08/24/17    I had the pleasure of seeing [name], a 40yr female  In for FU, evaluation and management for a full skin check today in addition to new concerns today.    Chief Concern: No new concerns as of today.    Past Medical History: Reviewed in EMR today 06/04/2018.  No past medical history on file.  Skin Cancer History: none  Allergies: Codeine and Hydrocodone-acetaminophen  Medications:  Reviewed in the EMR 06/04/2018.   Family History: There is not a family history of skin cancer.   Social History: The patient does not smoke or use tobacco products.  Lab/Imaging: Reviewed 06/04/2018       Review of Systems: Pertinent positives and negatives are noted in the HPI above. Pertinent negatives: No fever, no fatigue, no weight loss, no headaches, no cough.     Physical Examination / Assessment / Plan  There were no vitals taken for this visit.   A full body skin exam was done relating to moles/pigmented lesions, skin photodamage. On physical examination, the patient is in no apparent distress and breathing comfortably.  The patient is well developed, well nourished and pleasant with normal affect. The patient is alert to time, place and person. The cutaneous areas of the head, scalp, face and neck (including palpation), eyes/lids and conjunctiva, nose, mouth/oral mucosa/lips, chest (including the breast and axilla), back, abdomen, buttocks/groin, right and left upper extremities, right and left lower extremities (inspected for peripheral swelling/varicosities), digits and nails (inspected and palpated for clubbing, cyanosis, inflammation) were examined.  The examination of the above areas are within normal limits, except for any abnormalities noted below.     Diagnosis: Cherry angioma       C/O: red spots for unknown duration involving the chest and abdomen. Symptoms are none and none. Past/current treatment: none      PE: Scattered brightly erythematous, vascular papules on the trunk and  extremities.       Plan: The benign nature of these skin lesions was explained to the patient. No treatment is indicated at this time. The patient has been educated about self examination, and if changes occur including change in size,color or if lesions become symptomatic, the patient was instructed to return for follow up.     Diagnosis: Solar lentigines      C/O: dark spots for years duration involving the face and chest. Symptoms are moderate and spreading. Past/current treatment: None       PE: Multiple tan to brown, reticulate macules consistent with lentigines on the bilateral cheeks and upper chest      Plan:   -- broad spectrum Suncreen use encouraged and sunscreen handout provided  -- discussed cosmetic treatment options (HQ 4% and/or Retin-A 0.025% cream, fraxel laser), but the patient deferred treatment as of today.      Actinic Keratosis:   HX: Non healing pink scaly lesion(s) on L nose, present for months, asymptomatic, no prior treatment  PE: Total of 1 erythematous, gritty patches and thin papules located on the L nasal tip  AP: After obtaining verbal consent, 1 lesion(s) were treated with liquid nitrogen with a 10 second thaw time. The risks, including pain, blister formation, infection, hypo/hyperpigmentation, scar, recurrence, and the need for further treatment were discussed with the patient and informed consent was obtained verbally. The patient tolerated the treatment well and knows to return if the lesions do not resolve.     Liquid Nitrogen Procedure Note: Liquid nitrogen  was applied with the cry-ac spray device with a planned thaw time of 5 seconds.  1 lesions were treated.  Prior to the procedure the patient was warned about the risks of scar, infection, pigmentation changes. The patient was informed to not pick at the area and apply petrolatum until it heals.  The patient was asked to return if the lesion does not resolve or there are any problems with healing.    Benign appearing nevus       C/O: brown spot for years duration involving the L plantar surface. Symptom include persistent, stable, mild discoloration. The patient denies any new or persistenty bleeding, scaling, changing, or concerning lesions.      PE:  3 x 3 mm brown macule with mildly even border and uniform color and benign pigment network on the L plantar surface.     Plan: The benign nature of the skin lesion was explained to the patient. No treatment is indicated at this time. The patient has been educated about self examination, and if changes occur including change in size,color or if lesions become symptomatic, the patient was instructed to return for follow up.             Benign-appearing nevi:   Hx: Pt reports multiple brown flat and fleshy lesions on the trunk and extremities. Present for years, asymptomatic, no prior treatment. Of note she has no family history of melanoma.   PE: Multiple tan to brown macules and papules with even border and color and benign pigment network under epiluminescence microscopy on the trunk, upper extremities, and lower extremities.   AP: Pt was encouraged to perform self-skin examination approximately once a month to observe any changes in these currently benign-appearing nevi.     Photoprotection/skin cancer review:  The nature of sun-induced photo-aging and skin cancers is discussed.  Sun avoidance, protective clothing, and the use of 30-SPF sunscreens is advised. Observe closely for skin damage/changes, and call if such occurs.    Medication Review: We discussed risks, benefits, and proper use of dermatological medications with the patient who verbalized understanding.   Education Needs: Education needs were identified. There were no barriers to understanding, and the patient's questions and concerns were addressed.   Handouts Given and Explained: Sunscreen & Skin Cancer.     Follow-Up: 12 months or sooner as needed.      Eugenie Filler, PA-C  Supervising Physician:  Dr. Jae Dire,  MD

## 2018-06-04 NOTE — Nursing Note (Signed)
Pt's ID verified x 2: name/dob.  Allergies verified.  Pharmacy confirmed. Tobacco history reviewed.   Ziyana Morikawa, Sr.LVN

## 2018-06-06 NOTE — Progress Notes (Signed)
I did not personally see this patient, but I reviewed the patient's history, findings, and plan and agree with the findings and plan as outlined in the note above.   I was not present during the procedures.   Michaelle Bottomley Ann Ryaan Vanwagoner, MD

## 2018-06-19 ENCOUNTER — Ambulatory Visit: Payer: BLUE CROSS/BLUE SHIELD | Admitting: Nurse Practitioner

## 2018-06-19 ENCOUNTER — Ambulatory Visit: Payer: BLUE CROSS/BLUE SHIELD

## 2018-06-19 ENCOUNTER — Encounter: Payer: Self-pay | Admitting: Nurse Practitioner

## 2018-06-19 VITALS — BP 103/68 | HR 67 | Temp 97.0°F | Wt 130.3 lb

## 2018-06-19 DIAGNOSIS — Z1211 Encounter for screening for malignant neoplasm of colon: Principal | ICD-10-CM

## 2018-06-19 NOTE — Patient Instructions (Signed)
Turn in stool specimen

## 2018-06-19 NOTE — Nursing Note (Signed)
Vital signs taken, verified allergies, pharmacy and screened for pain.  Naylene Foell L Lynnox Girten, MAII

## 2018-06-19 NOTE — Progress Notes (Signed)
GI Consultation note requested by: ?    No chief complaint on file.    Wife present    Subjective: Samantha Stokes is a 52yr old female who presents for crc screening    No constipation/wt loss/abd pain/hematochezia    History:  I reviewed the patient's past medical and family/social history, and updated that information that was contributory .    Objective:    General Appearance: healthy, alert, no distress, pleasant affect, cooperative.  Abdomen:  Abdomen soft, non-tender.  No masses or organomegaly.  Hydration: well hydrated.    Labs/imaging:  Performed at [facility], reviewed: see computer database.  Performed outside of [place], reviewed if applicable: NA    Impression/recommendations:   52 y/o female in NAD here for evaluation of:    1.  crc screening. Asymptomatic. Discussed options/risks/benefits, patient opts for FIT    RTC: prn    Approximately 20 minutes were spent with the patient, of which more than 50% was spent counseling and/or coordinating care on crc screening. The patient understands and agrees with the plan of care as outlined.      Patient barriers to learning: none    Pt/family understanding: [place], NP

## 2018-07-10 ENCOUNTER — Ambulatory Visit: Payer: BLUE CROSS/BLUE SHIELD | Attending: Nurse Practitioner

## 2018-07-10 DIAGNOSIS — Z1211 Encounter for screening for malignant neoplasm of colon: Principal | ICD-10-CM | POA: Insufficient documentation

## 2018-07-15 LAB — FECAL BY IMMUNOASSAY (FIT): FECAL IMMUNOCHEMICAL TEST: NEGATIVE

## 2018-11-05 ENCOUNTER — Ambulatory Visit: Payer: BLUE CROSS/BLUE SHIELD | Admitting: Adult Health

## 2018-11-05 ENCOUNTER — Encounter: Payer: Self-pay | Admitting: Adult Health

## 2018-11-05 VITALS — BP 111/72 | HR 78 | Temp 97.9°F | Wt 128.5 lb

## 2018-11-05 DIAGNOSIS — R0789 Other chest pain: Secondary | ICD-10-CM

## 2018-11-05 DIAGNOSIS — R12 Heartburn: Principal | ICD-10-CM

## 2018-11-05 MED ORDER — OMEPRAZOLE 20 MG CAPSULE,DELAYED RELEASE
DELAYED_RELEASE_CAPSULE | ORAL | 0 refills | Status: DC
Start: 2018-11-05 — End: 2018-11-28

## 2018-11-05 NOTE — Patient Instructions (Signed)
Discharge Instructions    Ask pharmacy if ODT (prilosec, prevacid) is covered by insurance.     1 month trial of Omeprazole 20 mg. Take 1 twice daily x 2 weeks, then once daily thereafter.  Take on empty stomach at least 30 mins before food    Adhere to antireflux measures: eat small frequent meals - eat biggest meal of the day at noon; wait 2-3 hours after eating before laying down; Keep head of the bed elevated, 6-8 inches (i.e. wedge pillow, blocks under head of bed, electric bed). Do NOT use extra pillows (this will cause reflux). No tight garments around waist. Avoid wearing tight fitting clothing or belts; minimize aggravating foods/medications: aspirin, ibuprofen, naprosyn, alcoholic beverages, citrus, chocolate, caffeine, fatty/fried foods, spicy foods, peppermint, and tomato based products/sauces.    Notify Shanon Brow if not improving w/ above.

## 2018-11-05 NOTE — Nursing Note (Signed)
Vital signs taken, verified allergies, pharmacy and screened for pain.  Samantha Stokes, MAII

## 2018-11-05 NOTE — Progress Notes (Addendum)
DOS: 11/05/2018    Samantha Stokes  MRN: [id number]  DOB: 1966-06-30    GI Consultation requested by:   Patrina Levering, MD  [address]  [facility] [place] [address]    Subjective: Samantha Stokes is a 52yr old female with medical problems as below, who presents for reflux symptoms..    Patient Active Problem List   Diagnosis    Gastroparesis    Actinic keratosis    Seborrheic keratoses     HPI:   She presents to GI w/ c/o having reflux-like symptoms. Has had same symptoms years ago that resolved. Symptoms start in morning shortly after waking.  Has heartburn and chest pressure throughout the day. Symptoms relieved with laying down and with drinking carbonated water. Takes Zantac 150 mg 1-2 times/day. Not taking PPI. Doesn't sleep with HOB elevated.   EGD 04/2012 gastritis ([facility])  Evaluated by ENT 09/11/2018 at [place]. Transnasal laryngoscopy to evaluate the pharynx and larynx. FINDINGS: Findings show evidence of a small spur on the left nasal passageway, Patulous ET, Base of tongue is normal.     ROS:  A 14-point review of systems was done, and was negative, except as noted    Problem List:   Patient Active Problem List   Diagnosis    Gastroparesis    Actinic keratosis    Seborrheic keratoses       Past Medical History:  I reviewed the patient's medical history in the EMR.  No past medical history on file.    Past Surgical History:   I reviewed the patient's surgical history in the EMR.  Past Surgical History:   Procedure Laterality Date    HC BREATH HYDROGEN TST  11/28/2016         PR ESOPHAGOGASTRODUODENOSCOPY TRANSORAL DIAGNOSTIC  03/2010    gastirtis (suspect bile gastritis); sb bx wnl; path: gastritis; gej bx: inflammation    PR ESOPHAGOGASTRODUODENOSCOPY TRANSORAL DIAGNOSTIC  09/2007    mild esophagitis, moderte gastritis; path: gastritis and ? esopahgitis    PR ESOPHAGOGASTRODUODENOSCOPY TRANSORAL DIAGNOSTIC  04/2012    gastritis, path: gastritis    PR  LAP,CHOLECYSTECTOMY  2003    Cholecystectomy, lap    PR SIGMOIDOSCOPY FLX DX W/COLLJ SPEC BR/WA IF PFRMD  03/2010    int hemorrhoids       Family History:   I reviewed the patient's family history in the EMR.  Family History   Adopted: Yes       Social History:   I reviewed the patient's social history in the EMR.  Social History     Socioeconomic History    Marital status: MARRIED     Spouse name: Not on file    Number of children: Not on file    Years of education: Not on file    Highest education level: Not on file   Occupational History    Not on file   Social Needs    Financial resource strain: Not on file    Food insecurity:     Worry: Not on file     Inability: Not on file    Transportation needs:     Medical: Not on file     Non-medical: Not on file   Tobacco Use    Smoking status: Never Smoker    Smokeless tobacco: Never Used   Substance and Sexual Activity    Alcohol use: Yes     Alcohol/week: 0.0 standard drinks  Comment: <1/week    Drug use: No    Sexual activity: Not on file   Lifestyle    Physical activity:     Days per week: Not on file     Minutes per session: Not on file    Stress: Not on file   Relationships    Social connections:     Talks on phone: Not on file     Gets together: Not on file     Attends religious service: Not on file     Active member of club or organization: Not on file     Attends meetings of clubs or organizations: Not on file     Relationship status: Not on file    Intimate partner violence:     Fear of current or ex partner: Not on file     Emotionally abused: Not on file     Physically abused: Not on file     Forced sexual activity: Not on file   Other Topics Concern    Not on file   Social History Narrative    Not on file       Medications:   The patient's medications were reviewed today and updated in the EMR.  Current Outpatient Medications on File Prior to Visit   Medication Sig Dispense Refill    Ranitidine (ZANTAC) 150 mg Tablet Take 1 tablet by  mouth if needed. (acid) 60 tablet 1     No current facility-administered medications on file prior to visit.        Labs:  None    Physical Exam:  Temp: 36.6 C (97.9 F) (11/19 1321)  Temp src: Temporal (11/19 1321)  Pulse: 78 (11/19 1321)  BP: 111/72 (11/19 1321)  Resp: --  SpO2: 97 % (11/19 1321)  Height: --  Weight: 58.3 kg (128 lb 8.5 oz) (11/19 1321)      General Appearance: WDWN female, no distress, pleasant affect, cooperative.  Mental Status: AAOx3, NAD       Impression/Recommendations:  Samantha Stokes is a 51yr old female who presents today to GI for:    Chest pressure/heartburn: ?anxiety ?acid reflux ?bile reflux. Hx cholecystectomy. EGD 2013 w/ gastritis. Symptoms better on weekends when not at work.   -stop zantac for now  -trial of omeprazole 20 mg BID x 2 wks, then once daily x 2 wks. Prescription to pharmacy.  -avoid nsaid use  -antireflux measures  -she is to take note of whether or not she has symptoms when off work, relaxing, enjoying activities.    Patient barriers to learning: none    Patient/family understanding: verbalizes    RTC: f/u 1 month if not improving     Visit length: 27 minutes  > 50% time spent discussing antireflux measures, omeprazole, zantac, anxiety, acid vs bile reflux, as well as coordinating care on this patient. All questions/concerns were addressed to the satisfaction of the patient and she verbalizes understanding and agrees with the plan of care.    Ma Hillock, NP  PI# [phone]  Division of Gastroenterology  [facility]

## 2018-11-28 ENCOUNTER — Other Ambulatory Visit: Payer: Self-pay | Admitting: Adult Health

## 2018-11-28 NOTE — Telephone Encounter (Signed)
Patient was last seen 11/05/2018.  Samantha Stokes, MAII

## 2018-12-02 NOTE — Telephone Encounter (Signed)
Omeprazole 20 mg daily refill submitted to pharmacy. 90 day supply  Assunta Gambles, NP  Division of Gastroenterology  PI# [phone]

## 2019-06-03 ENCOUNTER — Ambulatory Visit: Payer: BLUE CROSS/BLUE SHIELD | Admitting: DERMATOLOGY

## 2019-06-03 ENCOUNTER — Encounter: Payer: Self-pay | Admitting: DERMATOLOGY

## 2019-06-03 VITALS — Temp 97.8°F

## 2019-06-03 DIAGNOSIS — L814 Other melanin hyperpigmentation: Secondary | ICD-10-CM

## 2019-06-03 DIAGNOSIS — I781 Nevus, non-neoplastic: Secondary | ICD-10-CM

## 2019-06-03 DIAGNOSIS — D1801 Hemangioma of skin and subcutaneous tissue: Secondary | ICD-10-CM

## 2019-06-03 NOTE — Progress Notes (Signed)
New Patient     I had the pleasure of seeing [name], a delightful 45yr female as a new patient for cancer screening; patient has been doing annual skin check; there is a nevus on her left instep which has been monitoring and also a new lesion on her R forearm that would like to check  Denies hx of skin cancer; wearing sun screen  Patient is adopted- not sure about Fhx  Hx of actinic keratosis on the nose which was treated with LN at last visit    Past Medical History:  Past Medical History:  No date: Gastroparesis  Family History:   family history is not on file. She was adopted.  Social History:  Social History     Tobacco Use    Smoking status: Never Smoker    Smokeless tobacco: Never Used   Substance Use Topics    Alcohol use: Yes     Alcohol/week: 0.0 standard drinks     Comment: <1/week    Drug use: No     Allergies: Codeine and Hydrocodone-acetaminophen  Medications: Omeprazole (PRILOSEC) 20 mg Delayed Release Capsule, Take 1 capsule by mouth once daily before a meal. PLEASE SEE ATTACHED FOR DETAILED DIRECTIONS  Ranitidine (ZANTAC) 150 mg Tablet, Take 1 tablet by mouth if needed. (acid)    No current facility-administered medications for this visit.     Review of Systems: Pertinent positives are noted in the history of present illness (HPI) above. All other review of systems is negative.    Physical Examination/ Assessment/ Plan   On physical examination, the patient is in no apparent distress and breathing comfortably. Vital signs were noted. The patient is well nourished and pleasant. The patient is alert to time, place and person. The cutaneous areas of the head and face, neck, chest including the breast, axilla, back, abdomen, right and left upper extremities, buttocks, and right and left lower extremities were examined. The genitalia was not examined. The examination of the above areas are within normal limits, except for any abnormalities are noted below.  Benign-appearing nevi: Multiple  tan to brown macules and papules with even border and color and benign pigment network under epiluminescence microscopy on the trunk, upper extremities, and lower extremities.   --Pt was encouraged to perform self-skin examination approximately once a month to observe any changes in these currently benign-appearing nevi.     Acral nevus on left instep:   3 x 3 mm brown macule with mildly even border and uniform color and benign pigment network on the L plantar surface.    The benign nature of the skin lesion was explained to the patient. No treatment is indicated at this time. The patient has been educated about self examination, and if changes occur including change in size,color or if lesions become symptomatic, the patient was instructed to return for follow    Dermatofibroma on R forearm: 4 mm firm light brown nodule  Reassuring features under dermatoscope  Monitor for now; RTC for any changes in size, shape     Cherry angiomas: Scattered brightly erythematous, vascular papules on the trunk and extremities.   --Benign nature of these skin lesions was explained to the patient. No treatment is indicated at this time. The patient has been educated about self examination, and if changes occur including change in size,color or if lesions become symptomatic, the patient was instructed to return for follow up.     Solar lentigines: Multiple tan to brown, reticulate macules with moth-eaten  border on epiluminescence microscopy consistent with lentigines on the face, upper back, and arms.   --Samantha Stokes protection education was reviewed with the patient, including using a sunscreen with broadspectrum protection with SPF of at least 30 and above, with frequent re-application.     Medication Review:   --We discussed risks, benefits, and proper use of dermatological medications with the patient. Patient verbalizes understanding of the proper use of dermatological medications.   Education needs were identified. There were no  barriers to understanding, and the patient's questions and concerns were addressed.   Thank you for allowing me to participate in the care of this patient.   We have asked Samantha Stokes to return to clinic in 12 months or sooner as needed.          Leanora Ivanoff, MD

## 2019-06-03 NOTE — Nursing Note (Signed)
Allergies and smoking history reviewed. Patient was screened for pain, Mobility assessment done, and pharmacy was verified. Wendee Copp, [place] 06/03/2019 2:36 PM

## 2019-06-17 NOTE — Progress Notes (Signed)
OUT-PATIENT GASTROENTEROLOGY CONSULT:    NAME: Samantha Stokes   AGE: 71yr   MRN: [id number]   PRIMARY PHYSICIAN: Patrina Levering, MD      This consultation/referral was requested by Dr. Patrina Levering, MD regarding the patient's chief compliant noted below.     CHIEF COMPLAINT:   Patient is a 57yr year old female here with   Chief Complaint   Patient presents with    Follow Up With Specialist   - colorectal cancer screening    HPI:     76yr year old female comes to the GI clinic for an evaluation.  Patient comes for:    - colon cancer screening.  Would like to do FIT test.  We discussed other options for colorectal cancer screening like Cologuard and colonoscopy.    - gastroparesis: has improved over the years.    The patient denies any of the following symptoms or known conditions: Jaundice, pruritus, increasing abdominal girth, leg swelling, change in sleep patterns, memory changes, nausea or vomiting, fever or chills, dysphagia, odynophagia, heartburn/indigestion, early satiety, loss of appetite, weight loss, hematemesis, melena, hematochezia,  anemia, persistent diarrhea, severe constipation, or nocturnal symptoms.    Patient denies chest pain, cough, shortness of breath, dyspnea on exertion, headaches, vertigo, or syncope.    FAMILY HISTORY: Patient denies history of colon cancer/esophageal cancer/gastric cancer/liver cancer/IBD/Celiac disease in first degree relatives.     WORK UP TO DATE: WORK UP AND/OR PERTINENT LAB VALUES:    COLONOSCOPY: n/a    EGD: done at [facility] in 2002    Reviewed lab results:  No results found for: WBC, HGB, HCT, PLT  No results found for: NA, K, CL, CO2, GLU, BUN, [place], ALB, ALP, AST, ALT  No results found for: INR  No components found for: LIPASE  No components found for: AMYLASE    Reviewed X-ray/Imaging:  N/a GI images      Past Medical History:   Diagnosis Date    Gastroparesis          Past Surgical History:   Procedure Laterality Date    HC BREATH HYDROGEN TST  11/28/2016          PR ESOPHAGOGASTRODUODENOSCOPY TRANSORAL DIAGNOSTIC  03/2010    gastirtis (suspect bile gastritis); sb bx wnl; path: gastritis; gej bx: inflammation    PR ESOPHAGOGASTRODUODENOSCOPY TRANSORAL DIAGNOSTIC  09/2007    mild esophagitis, moderte gastritis; path: gastritis and ? esopahgitis    PR ESOPHAGOGASTRODUODENOSCOPY TRANSORAL DIAGNOSTIC  04/2012    gastritis, path: gastritis    PR LAP,CHOLECYSTECTOMY  2003    Cholecystectomy, lap    PR SIGMOIDOSCOPY FLX DX W/COLLJ SPEC BR/WA IF PFRMD  03/2010    int hemorrhoids        Family History   Adopted: Yes       Social History     Socioeconomic History    Marital status: MARRIED     Spouse name: Not on file    Number of children: Not on file    Years of education: Not on file    Highest education level: Not on file   Occupational History    Not on file   Social Needs    Financial resource strain: Not on file    Food insecurity     Worry: Not on file     Inability: Not on file    Transportation needs     Medical: Not on file     Non-medical: Not on file  Tobacco Use    Smoking status: Never Smoker    Smokeless tobacco: Never Used   Substance and Sexual Activity    Alcohol use: Yes     Alcohol/week: 0.0 standard drinks     Comment: <1/week    Drug use: No    Sexual activity: Not on file   Lifestyle    Physical activity     Days per week: Not on file     Minutes per session: Not on file    Stress: Not on file   Relationships    Social connections     Talks on phone: Not on file     Gets together: Not on file     Attends religious service: Not on file     Active member of club or organization: Not on file     Attends meetings of clubs or organizations: Not on file     Relationship status: Not on file    Intimate partner violence     Fear of current or ex partner: Not on file     Emotionally abused: Not on file     Physically abused: Not on file     Forced sexual activity: Not on file   Other Topics Concern    Not on file   Social History Narrative    Not  on file        Current Outpatient Medications   Medication Sig Dispense Refill    Omeprazole (PRILOSEC) 20 mg Delayed Release Capsule Take 1 capsule by mouth once daily before a meal. PLEASE SEE ATTACHED FOR DETAILED DIRECTIONS 90 capsule 0    Ranitidine (ZANTAC) 150 mg Tablet Take 1 tablet by mouth if needed. (acid) 60 tablet 1     No current facility-administered medications for this visit.           Codeine    Nausea/Vomiting  Hydrocodone-Acetaminophen    Nausea/Vomiting     Review of symptoms:  General: no fever, chills, weight loss  Eyes: no blurry vision or diplopia  ENT:  no epistaxis, no sore throat or hoarseness  Cardiovascular:  No chest pain  Respiratory:  No shortness of breath, cough, wheezing, or sputum  Gastrointestinal:  As per HPI  Hematologic:  No easy bruisability, no swollen glands, no night sweats  Neurologic:  No drowsiness, weakness, memory loss, loss of consciousness or seizures  Genitourinary:  No burning or pain on urination, no incontinence  Musculoskeletal:  No joint pain  Skin: No rashes, pruritis, jaundice    Physical Exam:     CONSTITUTIONAL: Conversant, well developed female in NAD  VITALS: Temp: 36.8 C (98.3 F) (07/01 1059)  Temp src: Temporal (07/01 1059)  Pulse: 71 (07/01 1059)  BP: 108/70 (07/01 1059)  Resp: --  SpO2: 100 % (07/01 1059)  Height: 175.3 cm (5\' 9" ) (07/01 1059)  Weight: 55.2 kg (121 lb 11.1 oz) (07/01 1059)  PSYCH: intact judgement and insight. A&O x 3  SKIN: no rash, ulcers or lesions  NEURO: Cranial nerves II - XII grossly intact  EYES: anicteric sclerae, no lid-lag or proptosis  RESPIRATORY: normal respiratory effort, symmetrical chest rise  CARDIOVASCULAR: no peripheral edema  MUSCULOSKELETAL: no digital cyanosis or clubbing. Normal gait and station        Impression:  Patient is a 76yr female with a past medical history of gastroparesis presents with a chief complaint of colorectal cancer screening.     Plan:   Gastroparesis  (primary encounter  diagnosis)  Colon cancer screening  Plan: FECAL BY IMMUNOASSAY (FIT)     COLON CANCER SCREENING OPTIONS discussed:    Explained the various options for colorectal cancer screening:  1. Colonoscopy - both diagnostic/therapeutic, but slightly more risks (sedation, bleeding, perforation, missed lesions).  2. Barium Enema - only diagnostic, but less risky (no sedation); May need follow up colonoscopy if shows something.  3. Stool based colon cancer testing - Advantages: Does not require bowel preparation, noninvasive, and shown to be effective in randomized trials. Disadvantages: It is not a good test for the detection of polyps, which usually do not bleed, False-positive results.  A positive initial screening result will require a follow-up colonoscopy  4. CT Colonography - Advantages: noninvasive, does not require anesthesia, visualizes the entire bowel, and detects large adenomas.  Disadvantages: Radiation exposure, Abnormal results need to be followed up by colonoscopy, flat polyps can be missed.  5. Doing nothing - can't be certain there is not a polyp or cancer in the colon without any testing.    --- PLAN ---  1. After a lengthy discussion, patient has decided to do FIT test.  2. Patient states she will research Cologuard and Clenpiq as other options and if changes mind will call GI dept to schedule a colonoscopy.    Gastroparesis:  - well controlled      Follow up:    Plan is to follow up prn.    Thank you for allowing me to participate in the care of this patient.  Please do not hesitate to contact me with any questions or concerns.    Denmark Singh Aleksander Edmiston, MD  Associate Physician Diplomate  Division of Gastroenterology  [facility], [place]

## 2019-06-18 ENCOUNTER — Encounter: Payer: Self-pay | Admitting: Gastroenterology

## 2019-06-18 ENCOUNTER — Ambulatory Visit: Payer: BLUE CROSS/BLUE SHIELD | Admitting: Gastroenterology

## 2019-06-18 VITALS — BP 108/70 | HR 71 | Temp 98.3°F | Ht 69.0 in | Wt 121.7 lb

## 2019-06-18 DIAGNOSIS — Z1211 Encounter for screening for malignant neoplasm of colon: Secondary | ICD-10-CM

## 2019-06-18 DIAGNOSIS — K3184 Gastroparesis: Secondary | ICD-10-CM

## 2019-06-18 NOTE — Patient Instructions (Signed)
[place] YOU FOR VISITING GI CLINIC    Dear Samantha Stokes     I would like you to know that I appreciate the opportunity to participate in your care today. I hope you have confidence in knowing that I have reviewed your medical history before seeing you today and that it is my every intention to involve you, the patient, as we discuss your GI health, and decision making, whether it be about medications, tests, labs or procedures. My intention is to make your visit with our GI department one of your best and most satisfying experiences.    Below you will find the management plan from your clinic visit today.    Again, thank you for the opportunity to participate in your care.     Sincerely,    Denmark Singh Samantha Pellum, MD  Dept of Gastroenterology  [facility]    MANAGEMENT PLAN:  FIT test ordered    Other fecal tests for colorectal cancer screening are: Cologuard    Small volume preps are: Clenpiq and Plenvue

## 2019-06-18 NOTE — Nursing Note (Signed)
Vital signs taken, verified allergies, pharmacy and screened for pain.  Samantha Stokes L Hilton Saephan, MAII

## 2019-07-15 ENCOUNTER — Encounter: Payer: Self-pay | Admitting: Gastroenterology

## 2019-07-15 NOTE — Telephone Encounter (Signed)
From: Reginia Naas  To: Denmark S Fan, MD  Sent: 07/13/2019 9:45 AM PDT  Subject: Visit Follow-up Question    Hi Dr. Edison Simon,     I was in the office on June 18, 2019 and we discussed options for colon testing. You provided me an Rx for the FIT Test, however, after having given it some thought, I have decided to move forward with the colonoscopy. Since I have gasteroparesis, I would not tolerate the prep products well and researched alternative prep options. As there are currently two hospitals in the [place] using colonics as a means of prepping for a colonoscopy, I have chosen that route. I have a couple of questions as we proceed:    1. Since it's been over five years since my last upper endoscopy, would you be able to do that in addition to the colonoscopy?  2. How to I schedule my appointment?    Thank you Dr. Edison Simon. I look forward to hearing from you.

## 2019-07-21 ENCOUNTER — Ambulatory Visit: Payer: BLUE CROSS/BLUE SHIELD | Admitting: Family

## 2019-07-21 ENCOUNTER — Encounter: Payer: Self-pay | Admitting: Gastroenterology

## 2019-07-21 ENCOUNTER — Other Ambulatory Visit: Payer: Self-pay | Admitting: Gastroenterology

## 2019-07-21 DIAGNOSIS — Z1211 Encounter for screening for malignant neoplasm of colon: Secondary | ICD-10-CM

## 2019-07-21 DIAGNOSIS — K3184 Gastroparesis: Secondary | ICD-10-CM

## 2019-07-22 NOTE — Telephone Encounter (Signed)
[facility] will call patient to schedule EGD/Colonoscopy once the referrals are authorized. Dr. Edison Simon aware.

## 2019-08-08 ENCOUNTER — Encounter: Payer: Self-pay | Admitting: Internal Medicine

## 2019-09-02 ENCOUNTER — Other Ambulatory Visit: Payer: BLUE CROSS/BLUE SHIELD | Attending: Gastroenterology

## 2019-09-02 DIAGNOSIS — Z1211 Encounter for screening for malignant neoplasm of colon: Secondary | ICD-10-CM | POA: Insufficient documentation

## 2019-09-03 ENCOUNTER — Other Ambulatory Visit: Payer: Self-pay | Admitting: Gastroenterology

## 2019-09-03 DIAGNOSIS — R195 Other fecal abnormalities: Secondary | ICD-10-CM

## 2019-09-03 NOTE — Progress Notes (Signed)
lft

## 2019-09-08 ENCOUNTER — Ambulatory Visit: Payer: BLUE CROSS/BLUE SHIELD | Attending: Gastroenterology

## 2019-09-08 ENCOUNTER — Ambulatory Visit: Payer: BLUE CROSS/BLUE SHIELD | Admitting: Gastroenterology

## 2019-09-08 DIAGNOSIS — R195 Other fecal abnormalities: Secondary | ICD-10-CM

## 2019-09-08 LAB — HEPATIC FUNCTION PANEL
Alanine Transferase (ALT): 21 U/L (ref 5–54)
Albumin: 4.3 g/dL (ref 3.4–4.8)
Alkaline Phosphatase (ALP): 57 U/L (ref 35–115)
Aspartate Transaminase (AST): 19 U/L (ref 15–43)
Bilirubin Direct: 0.1 mg/dL (ref 0.0–0.2)
Bilirubin Total: 1.5 mg/dL — ABNORMAL HIGH (ref 0.3–1.3)
Protein: 7.4 g/dL (ref 6.3–8.3)

## 2019-09-08 LAB — FECAL BY IMMUNOASSAY (FIT): FECAL IMMUNOCHEMICAL TEST: NEGATIVE

## 2019-09-16 NOTE — Progress Notes (Signed)
I performed this clinical encounter by utilizing a real time telehealth video connection between my location and the patient's location. The patient's location was confirmed during this visit. I obtained verbal consent from the patient to perform this clinical encounter utilizing video and prepared the patient by answering any questions they had about the telehealth interaction.    GASTROENTEROLOGY VIDEO/TELEPHONE VISIT    MR#: [id number]   Date of Birth: 10-Oct-1966   Date of Service: 09/17/2019    PCP: Patrina Levering, MD, MD    CHIEF COMPLAINT: [place] colored stools    SUBJECTIVE:   Patient had questions about [place] colored stools and recent blood work.  She was taking a few supplements and has stopped them which has returned her stools back to a brownish color.  She had a slight elevation in the total bili but patient has known about this for many years. This slight increase in your total bilirubin was also noted back in 2016 from your blood work done at [facility].     OBJECTIVE:    Component      Latest Ref Rng & Units 09/08/2019          12:02 PM   PROTEIN      6.3 - 8.3 g/dL 7.4   ALKALINE PHOSPHATASE (ALP)      35 - 115 U/L 57   ASPARTATE TRANSAMINASE (AST)      15 - 43 U/L 19   BILIRUBIN TOTAL      0.3 - 1.3 mg/dL 1.5 (H)   ALANINE TRANSFERASE (ALT)      5 - 54 U/L 21   BILIRUBIN DIRECT      0.0 - 0.2 mg/dL 0.1   ALBUMIN      3.4 - 4.8 g/dL 4.3     Component      Latest Ref Rng & Units 09/02/2019          12:00 AM   FECAL IMMUNOCHEMICAL TEST      Negative Negative       Medications: Medication reconciliation was performed today.     Current Outpatient Medications   Medication Sig    Omeprazole (PRILOSEC) 20 mg Delayed Release Capsule Take 1 capsule by mouth once daily before a meal. PLEASE SEE ATTACHED FOR DETAILED DIRECTIONS    Ranitidine (ZANTAC) 150 mg Tablet Take 1 tablet by mouth if needed. (acid)     No current facility-administered medications for this visit.          Past Medical History:   Past Medical  History:   Diagnosis Date    Gastroparesis          Past Surgical History:   Past Surgical History:   Procedure Laterality Date    HC BREATH HYDROGEN TST  11/28/2016         PR ESOPHAGOGASTRODUODENOSCOPY TRANSORAL DIAGNOSTIC  03/2010    gastirtis (suspect bile gastritis); sb bx wnl; path: gastritis; gej bx: inflammation    PR ESOPHAGOGASTRODUODENOSCOPY TRANSORAL DIAGNOSTIC  09/2007    mild esophagitis, moderte gastritis; path: gastritis and ? esopahgitis    PR ESOPHAGOGASTRODUODENOSCOPY TRANSORAL DIAGNOSTIC  04/2012    gastritis, path: gastritis    PR LAP,CHOLECYSTECTOMY  2003    Cholecystectomy, lap    PR SIGMOIDOSCOPY FLX DX W/COLLJ SPEC BR/WA IF PFRMD  03/2010    int hemorrhoids        Imaging:The following reports were reviewed:  07/2016 ULTRASOUND:  Impression:   1. CHOLECYSTECTOMY. NO DUCTAL DILATATION.    Procedures:The  following endoscopy reports were reviewed:  Colo: n/a  EGD: per patient done at [facility] in 2002      ASSESMENT AND PLAN: 25yr female with a past medical history of gastroparesis and Rosanna Randy syndrome presents with a chief complaint of [place] colored stools, now resolved since stopping over the counter supplements.  FIT negative.    # [place] colored stools:  - returning to normal.  - avoid supplements.  In the future, discuss with your PCP regarding taking supplements.    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  - stable    # Colon Cancer Screening:  -Patient would like to have screening colonoscopy in January 2021      Thank you for allowing me to participate in the care of this patient. Please do not hesitate to contact us with any questions.      Denmark Singh Wisam Siefring, MD  Associate Physician Diplomate  Division of Gastroenterology  [facility], [place]      I spent a total of 15 minutes today on this patient's visit. This included 10 minutes of face to face time, more than 50% of which was spent in counseling or coordinating care, for the following medical problem(s)  Gilbert's syndrome. The remainder of the time was spent on review of the patient record (including but not limited to clinical notes, outside records, laboratory and radiographic studies), medical decision-making, and documentation of the visit.

## 2019-09-17 ENCOUNTER — Encounter: Payer: Self-pay | Admitting: Gastroenterology

## 2019-09-17 ENCOUNTER — Ambulatory Visit: Payer: BLUE CROSS/BLUE SHIELD | Admitting: Gastroenterology

## 2019-09-17 NOTE — Progress Notes (Unsigned)
Received call from pt whom states she has a appointment at 9.40 AM , would like to switch to either phone call or video visit. Pt's phone number is [phone]. Thank you.

## 2019-09-17 NOTE — Patient Instructions (Signed)
[place] YOU FOR VISITING GI CLINIC    Dear Samantha Stokes     I would like you to know that I appreciate the opportunity to participate in your care today. I hope you have confidence in knowing that I have reviewed your medical history before seeing you today and that it is my every intention to involve you, the patient, as we discuss your GI health, and decision making, whether it be about medications, tests, labs or procedures. My intention is to make your visit with our GI department one of your best and most satisfying experiences.    Below you will find the management plan from your clinic visit today.    Again, thank you for the opportunity to participate in your care.     Sincerely,    Samantha Singh [name], MD  Dept of Gastroenterology  Samantha Stokes    MANAGEMENT PLAN:  # [place] colored stools:  - returning to normal.  - avoid supplements.  In the future, discuss with your PCP regarding taking supplements.    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  - stable    # Colon Cancer Screening:  -Patient would like to have screening colonoscopy in January 2021    FOLLOW UP:  Return to GI clinic as needed.

## 2019-09-24 NOTE — Progress Notes (Signed)
09/25/2019, last visit 06/03/2019 with Dr. Lutricia Feil    I had the pleasure of seeing your patient Samantha Stokes, a 89yr  female follow up    The patient's chief concern is changing spot on right forearm.      Past Medical History:  Past Medical History:   Diagnosis Date    Gastroparesis       Prior history of skin cancer denied, only history of actinic keratosis  Allergies:   Codeine and Hydrocodone-acetaminophen    Review of Systems: The patient was asked about persistent fever, unintentional weight loss, palpitation, persistent cough, abdominal pain, headache, pacemaker or other implants, current pregnancy. Positive Findings: None    Physical Examination:  General. Well-developed, well nourished, no distress  Neuro: Alert/oriented x 3   Psych: Normal mood/affect.     A expanded focused body skin exam was done based on the patient's chief complaint and preference. The patient has FBSE on 06/03/2019      Head/scalp, face, eyelids, nose, lips, R and L upper extremities.     Impression/Problems/Plan:   Dx: Neoplasm of uncertain behavior, location: right foreram  Ddx: Superficial thrombophlebitis or thrombsed vein vs hemosiderotic dermatofibroma  HX: Hx of persistent lesion on right forearm. Present years, was stable but suddenly became purplish and mild tender since August. Prior treatment: none. The patient stated she has been working from home and has no good cushing or protection when using computer so possible constant hitting of the spot.  PE: 0.6 cm ill-defined grayish to purplish elastic dermal papule.   AP:   - Likely benign nature discussed. Recommend conservative treatments for now, including warm packing, gentle massage, monitoring. The patient should contact us if significant change of the spot.  - Advise re evaluation if the spot does not resolve in 6 months.          Dx: Milia, forehead and along hairline  HX: Hx of yellow Holzworth bump on forehead for months. Seems to be growing. Asymptomatic but visible. No  prior treatments.  PE: several 2-3 mm Bari cystic lesion on forehead and along hairline  AP:    - Discussed that milia are very common, benign, keratin-filled cysts and are derived from the pilosebaceous follicle. No treatments required. The patient endorsed understanding.      Dx: Benign-appearing nevi on left instep, no change:   Hx: Pt reports a long standing spot on right plantar foot. Present for years, asymptomatic, no prior treatment of any of these lesions. Photo took on 06/04/2018 was reviewed: no change  PE: 3 x 3 mm brown macule with mildly uneven border and uniform color and benign pigment network on the L plantar surface.  AP:   - Pt was encouraged to perform self-skin examination approximately once a month to observe any changes in these currently benign-appearing nevi. Discussed that concerning changes may include developing bumps or nodules, bleeding, itching, pain or changes in the shape and color; advised follow up in clinic with any changes. Sun protection education was reviewed with the patient, including using a sunscreen with broadspectrum (UVA/UVB) protection with SPF of at least 30 and above, with frequent re-application.     Photoprotection/skin cancer review:  The nature of sun-induced photo-aging and skin cancers is discussed.  Sun avoidance, protective clothing, and the use of 30-SPF sunscreens is advised. Observe closely for skin damage/changes, and call if such occurs.    Education needs were addressed and there were no barriers to learning.     Return to  clinic in 6 months if spot on right forearm does not resolve or yearly for skin check.    Samantha Stokes, M.D., M.S.  Attending Physician  [place] Department of Dermatology

## 2019-09-25 ENCOUNTER — Encounter: Payer: Self-pay | Admitting: Dermatology

## 2019-09-25 ENCOUNTER — Ambulatory Visit: Payer: BLUE CROSS/BLUE SHIELD | Admitting: Dermatology

## 2019-09-25 VITALS — Temp 97.7°F

## 2019-09-25 DIAGNOSIS — R238 Other skin changes: Secondary | ICD-10-CM

## 2019-09-25 DIAGNOSIS — D225 Melanocytic nevi of trunk: Secondary | ICD-10-CM

## 2019-09-25 DIAGNOSIS — D2372 Other benign neoplasm of skin of left lower limb, including hip: Secondary | ICD-10-CM

## 2019-09-25 DIAGNOSIS — L720 Epidermal cyst: Secondary | ICD-10-CM

## 2019-09-25 DIAGNOSIS — D485 Neoplasm of uncertain behavior of skin: Secondary | ICD-10-CM

## 2019-09-25 NOTE — Progress Notes (Deleted)
09/25/19  Last visit 06/03/2019 with Dr. Leroy Kennedy    53 year old female here for follow-up:        Past Medical History:  Past Medical History:  No date: Gastroparesis  Family History:   family history is not on file. She was adopted.  Social History:  Social History     Tobacco Use    Smoking status: Never Smoker    Smokeless tobacco: Never Used   Substance Use Topics    Alcohol use: Yes     Alcohol/week: 0.0 standard drinks     Comment: <1/week    Drug use: No     Allergies: Codeine and Hydrocodone-acetaminophen  Medications: Omeprazole (PRILOSEC) 20 mg Delayed Release Capsule, Take 1 capsule by mouth once daily before a meal. PLEASE SEE ATTACHED FOR DETAILED DIRECTIONS  Ranitidine (ZANTAC) 150 mg Tablet, Take 1 tablet by mouth if needed. (acid)    No current facility-administered medications for this visit.     Review of Systems: Pertinent positives are noted in the history of present illness (HPI) above. All other review of systems is negative.    Physical Examination/ Assessment/ Plan   On physical examination, the patient is in no apparent distress and breathing comfortably. Vital signs were noted. The patient is well nourished and pleasant. The patient is alert to time, place and person. The cutaneous areas of the head and face, neck, chest including the breast, axilla, back, abdomen, right and left upper extremities, buttocks, and right and left lower extremities were examined. The genitalia was not examined. The examination of the above areas are within normal limits, except for any abnormalities are noted below.    Dx: Neoplasm of uncertain behavior, location: right foreram  Thrombosed vein vs hemorrhagic dermatofibroma  HX: Hx of persistent lesion on ***. Present months, slowly growing. Associated symptoms: ***, Prior treatment: none.   PE: 0.6 cm ill-defined purplish papule  AP: Tangential biopsy  Procedure note: Tangential biopsy. Detailed informed consent was obtained.  Patient identity and  biopsy site confirmed.  Pre-op photos taken and uploaded to EMR.  Alcohol prep.  Local anesthesia with lidocaine 0.5%***1%  with/without epinephrine.  Biopsy performed with dermablade. Hemostasis with ***aluminum chloride/hyfrecator/heat pen.   Dressing placed. Tolerated well, no complications.       Benign-appearing nevi: Multiple tan to brown macules and papules with even border and color and benign pigment network under epiluminescence microscopy on the trunk, upper extremities, and lower extremities.   --Pt was encouraged to perform self-skin examination approximately once a month to observe any changes in these currently benign-appearing nevi.     Acral nevus on left instep:   3 x 3 mm brown macule with mildly even border and uniform color and benign pigment network on the L plantar surface.    The benign nature of the skin lesion was explained to the patient. No treatment is indicated at this time. The patient has been educated about self examination, and if changes occur including change in size,color or if lesions become symptomatic, the patient was instructed to return for follow    Dermatofibroma on R forearm: 4 mm firm light brown nodule  Reassuring features under dermatoscope  Monitor for now; RTC for any changes in size, shape     Cherry angiomas: Scattered brightly erythematous, vascular papules on the trunk and extremities.   --Benign nature of these skin lesions was explained to the patient. No treatment is indicated at this time. The patient has been educated about self  examination, and if changes occur including change in size,color or if lesions become symptomatic, the patient was instructed to return for follow up.     Solar lentigines: Multiple tan to brown, reticulate macules with moth-eaten border on epiluminescence microscopy consistent with lentigines on the face, upper back, and arms.   --Nancy Fetter protection education was reviewed with the patient, including using a sunscreen with broadspectrum  protection with SPF of at least 30 and above, with frequent re-application.     Medication Review:   --We discussed risks, benefits, and proper use of dermatological medications with the patient. Patient verbalizes understanding of the proper use of dermatological medications.   Education needs were identified. There were no barriers to understanding, and the patient's questions and concerns were addressed.   Thank you for allowing me to participate in the care of this patient.   We have asked Ms. Zupko to return to clinic in 12 months or sooner as needed.

## 2019-09-25 NOTE — Nursing Note (Signed)
Allergies and smoking history reviewed. Screened for pain, verified with patient and marked in EMR what medications they are currently taking and not taking, and pharmacy verified.

## 2020-01-21 NOTE — Progress Notes (Deleted)
Last visit 09/25/2019    I had the pleasure of seeing your patient Samantha Stokes, a 17yr  female follow up    The patient's chief concern is changing spot on lip    Past Medical History:  Past Medical History:   Diagnosis Date    Gastroparesis       Prior history of skin cancer denied, only history of actinic keratosis  Allergies:   Codeine and Hydrocodone-acetaminophen    Physical Examination:  General. Well-developed, well nourished, no distress  Neuro: Alert/oriented x 3   Psych: Normal mood/affect.     A expanded focused body skin exam was done based on the patient's chief complaint and preference. The patient has FBSE on 06/03/2019      Head/scalp, face, eyelids, nose, lips, R and L upper extremities.     Impression/Problems/Plan:   Dx: Neoplasm of uncertain behavior, location: right foreram  Ddx: Superficial thrombophlebitis or thrombsed vein vs hemosiderotic dermatofibroma  HX: Hx of persistent lesion on right forearm. Present years, was stable but suddenly became purplish and mild tender since August. Prior treatment: none. The patient stated she has been working from home and has no good cushing or protection when using computer so possible constant hitting of the spot.  PE: 0.6 cm ill-defined grayish to purplish elastic dermal papule.   AP:   - Likely benign nature discussed. Recommend conservative treatments for now, including warm packing, gentle massage, monitoring. The patient should contact us if significant change of the spot.  - Advise re evaluation if the spot does not resolve in 6 months.          Dx: Milia, forehead and along hairline  HX: Hx of yellow [place] bump on forehead for months. Seems to be growing. Asymptomatic but visible. No prior treatments.  PE: several 2-3 mm [place] cystic lesion on forehead and along hairline  AP:    - Discussed that milia are very common, benign, keratin-filled cysts and are derived from the pilosebaceous follicle. No treatments required. The patient endorsed  understanding.      Dx: Benign-appearing nevi on left instep, no change:   Hx: Pt reports a long standing spot on right plantar foot. Present for years, asymptomatic, no prior treatment of any of these lesions. Photo took on 06/04/2018 was reviewed: no change  PE: 3 x 3 mm brown macule with mildly uneven border and uniform color and benign pigment network on the L plantar surface.  AP:   - Pt was encouraged to perform self-skin examination approximately once a month to observe any changes in these currently benign-appearing nevi. Discussed that concerning changes may include developing bumps or nodules, bleeding, itching, pain or changes in the shape and color; advised follow up in clinic with any changes. Sun protection education was reviewed with the patient, including using a sunscreen with broadspectrum (UVA/UVB) protection with SPF of at least 30 and above, with frequent re-application.     Photoprotection/skin cancer review:  The nature of sun-induced photo-aging and skin cancers is discussed.  Sun avoidance, protective clothing, and the use of 30-SPF sunscreens is advised. Observe closely for skin damage/changes, and call if such occurs.    Education needs were addressed and there were no barriers to learning.     Return to clinic in 6 months if spot on right forearm does not resolve or yearly for skin check.     patient mask status:20356   airborne/isolation:[id number] precautions were followed when caring for the patient.  PPE used by provider during encounter:20355     Yi-Chun A. Bridgett Larsson, M.D., M.S.  Attending Physician  [place] [facility]

## 2020-01-22 ENCOUNTER — Ambulatory Visit: Payer: BLUE CROSS/BLUE SHIELD | Admitting: Dermatology

## 2020-01-22 ENCOUNTER — Encounter: Payer: Self-pay | Admitting: Dermatology

## 2020-01-22 VITALS — Temp 97.8°F

## 2020-01-22 DIAGNOSIS — L720 Epidermal cyst: Secondary | ICD-10-CM

## 2020-01-22 DIAGNOSIS — L570 Actinic keratosis: Secondary | ICD-10-CM

## 2020-01-22 DIAGNOSIS — D229 Melanocytic nevi, unspecified: Secondary | ICD-10-CM

## 2020-01-22 DIAGNOSIS — R238 Other skin changes: Secondary | ICD-10-CM

## 2020-01-22 NOTE — Progress Notes (Signed)
01/22/2020, last visit 09/25/2019    I had the pleasure of seeing your patient Samantha Stokes, a 42yr  female follow up    The patient's chief concern is changing spot on lip.    Past Medical History:  Past Medical History:   Diagnosis Date    Gastroparesis      Prior history of skin cancer denied, only history of actinic keratosis    Allergies:  Codeine and Hydrocodone-acetaminophen     Physical Examination:  General. Well-developed, well nourished, no distress  Neuro: Alert/oriented x 3   Psych: Normal mood/affect.     A expanded focused body skin exam was done based on the patient's chief complaint and preference.    Ll[place], R upper extremities.     Impression/Problems/Plan:     Dx: Actinic Keratosis (new problem)  HPI: Non healing pink gritty slightly thicker scaly lesion on lip, present for months, asymptomatic, no prior treatment.  PE: Total of 1 erythematous, gritty, slightly thicker keratotic papule located on the lip.  AP:   -- After obtaining verbal consent, 1 lesion was treated with liquid nitrogen with a 10 second thaw time.   -- The risks, including pain, blister formation, infection, hypo/hyperpigmentation, scar, recurrence, and the need for further treatment were discussed with the patient and informed consent was obtained verbally.   -- The patient tolerated the treatment well and knows to return if the lesions do not resolve.     Dx: Neoplasm of uncertain behavior, location: right foreram  Ddx: Superficial thrombophlebitis or thrombsed vein vs hemosiderotic dermatofibroma  HPI: Hx of persistent lesion on right forearm. Present years, was stable but suddenly became purplish and mild tender since August 2020. Prior treatment: none. The patient stated she has been working from home and has no good cushing or protection when using computer so possible constant hitting of the spot.  Interval: No change noted by patient, today, 01/22/2020.   PE: 0.6 cm ill-defined grayish to purplish elastic dermal papule.   AP:    -- Likely benign nature discussed. Recommend conservative treatments for now, including warm packing, gentle massage, monitoring. The patient should contact us if significant change of the spot.  -- Advise re-evaluation if the spot does not continue to resolve.  -- Reviewed photos from last visit and compared to exam today. No changes.    (Not discussed today)  Dx: Benign-appearing nevi on left instep, no change:   HPI: Pt reports a long standing spot on right plantar foot. Present for years, asymptomatic, no prior treatment of any of these lesions. Photo took on 06/04/2018 was reviewed today, 01/22/2020: no change  PE: 3 x 3 mm brown macule with mildly uneven border and uniform color and benign pigment network on the L plantar surface.  AP:   -- Pt was encouraged to perform self-skin examination approximately once a month to observe any changes in these currently benign-appearing nevi.   -- Discussed that concerning changes may include developing bumps or nodules, bleeding, itching, pain or changes in the shape and color; advised follow up in clinic with any changes.   -- Nancy Fetter protection education was reviewed with the patient, including using a sunscreen with broadspectrum (UVA/UVB) protection with SPF of at least 30 and above, with frequent re-application.     Dx: Milia, forehead and along hairline  HPI: Hx of yellow [place] bump on forehead for months. Seems to be growing. Asymptomatic but visible. No prior treatments.  PE: Several 2-3 mm [place] cystic lesion on  forehead and along hairline  AP:    -- Discussed that milia are very common, benign, keratin-filled cysts and are derived from the pilosebaceous follicle. No treatments required. The patient endorsed understanding.      Photoprotection/skin cancer review:  The nature of sun-induced photo-aging and skin cancers is discussed.  Sun avoidance, protective clothing, and the use of 30-SPF sunscreens is advised. Observe closely for skin damage/changes, and call if  such occurs.  Education needs were addressed and there were no barriers to learning.     RTC: as needed if the spot on lip does not resolve.    Patient WAS wearing a surgical mask  Contact and Droplet precautions were followed when caring for the patient.   PPE used by provider during encounter: Surgical mask and Face Shield/Goggles   Patient's mask was removed for examination.    SCRIBE DISCLAIMER:  This note was scribed by Leretha Dykes, SCRIBE, a trained medical scribe, in the presence of Yi-Chun A. Bridgett Larsson, M.D., M.S.    Electronically Signed By Leretha Dykes, [place], Scribe, 01/22/2020, 9:52 AM.    ATTENDING ATTESTATION    This document serves as my record of services that I personally performed and was taken in my presence. It was created on 01/22/2020 by the medical scribe noted above.I personally performed the procedures above.   I have reviewed this document and agree that this note accurately reflects the history and exam findings, the patient care provided, and my medical decision making.    Yi-Chun A. Bridgett Larsson, M.D., M.S.  Attending Physician  [place] [facility]

## 2020-01-22 NOTE — Nursing Note (Signed)
Chief complaint noted. Allergies and smoking history reviewed. Patient was screened for pain, Mobility assessment done, and pharmacy was verified. Wendee Copp, MAII 01/22/2020 1:22 PM      Patient WAS wearing a surgical mask.  Contact and Droplet precautions were followed when caring for the patient.   PPE used by provider during encounter: Surgical mask and Face Shield/Goggles.    Wendee Copp, MAII 01/22/2020 1:22 PM

## 2020-03-31 ENCOUNTER — Encounter: Payer: Self-pay | Admitting: Gastroenterology

## 2020-03-31 NOTE — Telephone Encounter (Signed)
From: Reginia Naas  To: Denmark S Fan, MD  Sent: 03/31/2020 9:53 AM PDT  Subject: Non-urgent Medical Advice Question    Good morning Dr. Edison Simon,     Since I am due for a colonoscopy, my clinical nutritionist recommended a water-assisted colonoscopy. She had the procedure with a doctor in [place] and said to research doctors in the [place] area that do that procedure. The only one I could find is Dr. Birdena Jubilee. As it turns out, I learned he practices in the same [facility] location as where I see you.     I have some questions about the procedure and would like to know who best to speak with.     Thank you.   Samantha Stokes

## 2020-05-19 ENCOUNTER — Ambulatory Visit: Payer: BLUE CROSS/BLUE SHIELD | Admitting: Gastroenterology

## 2020-05-19 VITALS — BP 116/77 | HR 69 | Temp 98.1°F | Resp 10 | Ht 69.0 in | Wt 129.2 lb

## 2020-05-19 DIAGNOSIS — Z1211 Encounter for screening for malignant neoplasm of colon: Secondary | ICD-10-CM

## 2020-05-19 NOTE — Nursing Note (Signed)
Vitals signs taken, screened for pain, verified allergies and verified pharmacy.  Paulette Rockford Carreau MA II      .PPE Used During the Visit:    Eye and Face Protection:    Body Protection:   []Glasses/Goggles      []Gown   [x]Face Shield   [x]Surgical Mask     Hand Protection:    []N-95 Mask       []Gloves   []Particulate Respirator   []Half/Full Face Elastomeric Respirator   []PAPR      Patient wearing mask

## 2020-05-19 NOTE — Progress Notes (Signed)
MR#: [id number]   DOB: 1966/11/09   DOS: 05/19/2020        [id number] about colonoscopy        Dear Patrina Levering, MD,         We had the pleasure of consulting with your patient, Samantha Stokes, in Gastroenterology clinic today to discuss water colonoscopy for colon cancer screening. As you know, Samantha Stokes, is a 23yr female who has a hx of hypothyroidism, cholecystectomy who wanted to discuss screening colonoscopy today. She had very good questions about the technique we use (including water colonoscopy) and how sedation is done.      Allergies:   Allergies   Allergen Reactions    Codeine Nausea/Vomiting    Hydrocodone-Acetaminophen Nausea/Vomiting           Medications: Medication reconciliation was  performed today.     Current Outpatient Medications   Medication Sig    Omeprazole (PRILOSEC) 20 mg Delayed Release Capsule Take 1 capsule by mouth once daily before a meal. PLEASE SEE ATTACHED FOR DETAILED DIRECTIONS    Progesterone Micronized (PROMETRIUM) 200 mg capsule Take 200 mg by mouth every day. in the evening    Ranitidine (ZANTAC) 150 mg Tablet Take 1 tablet by mouth if needed. (acid)     No current facility-administered medications for this visit.         Past Medical History:   Past Medical History:   Diagnosis Date    Gastroparesis          Past Surgical History:   Past Surgical History:   Procedure Laterality Date    HC BREATH HYDROGEN TST  11/28/2016         PR ESOPHAGOGASTRODUODENOSCOPY TRANSORAL DIAGNOSTIC  03/2010    gastirtis (suspect bile gastritis); sb bx wnl; path: gastritis; gej bx: inflammation    PR ESOPHAGOGASTRODUODENOSCOPY TRANSORAL DIAGNOSTIC  09/2007    mild esophagitis, moderte gastritis; path: gastritis and ? esopahgitis    PR ESOPHAGOGASTRODUODENOSCOPY TRANSORAL DIAGNOSTIC  04/2012    gastritis, path: gastritis    PR LAP,CHOLECYSTECTOMY  2003    Cholecystectomy, lap    PR SIGMOIDOSCOPY FLX DX W/COLLJ SPEC BR/WA IF PFRMD  03/2010    int hemorrhoids        Family History:    Family History   Adopted: Yes       Social History:   Social History     Socioeconomic History    Marital status: MARRIED     Spouse name: Not on file    Number of children: Not on file    Years of education: Not on file    Highest education level: Not on file   Occupational History    Not on file   Tobacco Use    Smoking status: Never Smoker    Smokeless tobacco: Never Used   Substance and Sexual Activity    Alcohol use: Yes     Alcohol/week: 0.0 standard drinks     Comment: <1/week    Drug use: No    Sexual activity: Not on file   Other Topics Concern    Not on file   Social History Narrative    Not on file     Social Determinants of Health     Financial Resource Strain:     Difficulty of Paying Living Expenses:    Food Insecurity:     Worried About [place] in the Last Year:     [place] in the Last  Year:    Transportation Needs:     Film/video editor (Medical):     Lack of Transportation (Non-Medical):    Physical Activity:     Days of Exercise per Week:     Minutes of Exercise per Session:    Stress:     Feeling of Stress :    Social Connections:     Frequency of Communication with Friends and Family:     Frequency of Social Gatherings with Friends and Family:     Attends Religious Services:     Active Member of Clubs or Organizations:     Attends Music therapist:     Marital Status:    Intimate Partner Violence:     Fear of Current or Ex-Partner:     Emotionally Abused:     Physically Abused:     Sexually Abused:             Labs:     BCP: No results found for: NA, K, CL, CO2, BUN, CR, GLU    LFT:   Lab Results   Lab Name Value Date/Time    AST 19 09/08/2019 12:02 PM    ALT 21 09/08/2019 12:02 PM    ALP 57 09/08/2019 12:02 PM    ALB 4.3 09/08/2019 12:02 PM    TP 7.4 09/08/2019 12:02 PM    TBIL 1.5 (H) 09/08/2019 12:02 PM       CBC: No results found for: WBC, HGB, HCT, PLT      Imaging: The following imaging reports were  reviewed:      Procedures:The following endoscopy reports were reviewed:      Colo:   EGD:               Assessment and Plan: Samantha Stokes is a 26yr F here to discuss colon cancer screening            #Colon cancer screening  -she elects to proceed with water colonoscopy and conscious sedation            Thank you for allowing [place] to participate in the care of this patient. Please do not hesitate to contact us with any questions.    Sincerely,      Britt Boozer, MD        Number and Complexity of Problems Addressed  1 self-limited or minor problems    Time  I spent a total of 16 minutes on the day of the visit.          EDUCATION:  I educated/instructed the patient or caregiver regarding all aspects of the above stated plan of care.  The patient or caregiver indicated understanding.      [place] interpreter was not used.

## 2020-07-21 ENCOUNTER — Encounter: Payer: Self-pay | Admitting: Dermatology

## 2020-07-21 ENCOUNTER — Ambulatory Visit: Payer: BLUE CROSS/BLUE SHIELD | Admitting: Dermatology

## 2020-07-21 DIAGNOSIS — D485 Neoplasm of uncertain behavior of skin: Secondary | ICD-10-CM

## 2020-07-21 DIAGNOSIS — L578 Other skin changes due to chronic exposure to nonionizing radiation: Secondary | ICD-10-CM

## 2020-07-21 DIAGNOSIS — D2272 Melanocytic nevi of left lower limb, including hip: Secondary | ICD-10-CM

## 2020-07-21 DIAGNOSIS — D1801 Hemangioma of skin and subcutaneous tissue: Secondary | ICD-10-CM

## 2020-07-21 DIAGNOSIS — D2361 Other benign neoplasm of skin of right upper limb, including shoulder: Secondary | ICD-10-CM

## 2020-07-21 NOTE — Progress Notes (Signed)
Follow-up Visit 07/21/2020, last seen by Dr. Bridgett Larsson on 01/22/2020    Reason for Visit: Lesion on left sole    History of Present Illness:     Samantha Stokes is an 22yr female who presents today for a full body skin exam with her main concern being of a lesion on her left sole x present for years. Patient has been actively monitoring it with pictures for reference. Believes it has grown in size. No previous treatment.     Otherwise, patient reports of a lesion on her right arm x for months.  Notes of pain when pinched on. No previous treatment.       Past Medical History:  1.  Negative for skin cancer     Past Medical History:   Diagnosis Date    Gastroparesis          Family History:  1.  Negative for melanoma     Family History   Adopted: Yes         Social History:  1.  Tobacco use: None  2.  Marital status: Married      Medications and Allergies: reviewed by MD today        Review of Systems: All other systems reviewed and negative, except as noted in the HPI        Physical Examination:    General:  Appears comfortable, well-developed, well-nourished, and pleasant with normal affect.  Neuro:  Alert and oriented to person, time, and place    Cutaneous:      Exam of the scalp, ears, face, eyelids/eyes, lips, neck, chest, axillae, abdomen, back, buttocks/groin, right and left upper extremities, right and left lower extremities, and digits & nails were notable for the following:     The pertinent findings are as listed below:    Left sole: 6 mm x 4 mm light brown macule with parallel furrow pattern on dermoscopy     Right forearm: Dark brown slightly tender macule with focal deeper subtle papular component    Trunk / All 4 extremities: bright red smooth dome-shaped papules    Sun-exposed areas:  mottled dyspigmentation, telangiectasias, and elastosis        Assessment / Plan:       1.  Acral Nevus----Left sole    - Has reassuring pattern on dermoscopy at today's visit  - However, given pt's reported history of change and  difficult area for her to monitor, we did discuss biopsy today to exclude atypical melanocytic neoplasm  - The patient prefers to monitor  - Measurements and photographs were obtained today  - Pt to return to clinic in 4 months; if any change in size or appearance at that time, will biopsy       2. Neoplasm of Uncertain Behavior----Right forearm     - Rule out Hemosiderotic DF vs ?glomus, rule out atypical melanocytic neoplasm  - Recommended punch biopsy to further evaluate; pt agreeable  - Written consent was obtained.  Benefits, alternatives, and risks of procedure (scar, bleeding, infection) were discussed.  Photograph was obtained.  The affected area was marked and prepped with alcohol.  Anesthesia:  0.5% lidocaine with epinephrine buffered with sodium bicarbonate.  Biopsy obtained through shave technique using punch technique (4 mm).  Hemostasis:  4-0 Chromic. Specimen was sent for histopathology.  Wound dressing was applied and wound care reviewed.       3. Samantha Stokes / All 4 extremities    - Benign nature of the lesion discussed  and reassurance given. No treatment indicated at this time.      4. Dermatoheliosis     - Sun protection reviewed including use of sunscreen SPF 30 or greater, use of wide-brimmed hat, and sun avoidance       - Follow up in 4 months.      Patient WAS wearing a surgical mask  Contact precautions were followed when caring for the patient.   PPE used by provider during encounter: Surgical mask      SCRIBE DISCLAIMER:  I, Claudie Fisherman, SCRIBE, am personally taking down the notes in the presence of Dr. Lynden Ang, MD.  Electronically signed by - Claudie Fisherman, SCRIBE  07/21/2020 4:18 PM      PROVIDER DISCLAIMER:  Cyndia Bent, MD, personally performed the services described in this documentation, as scribed by the trained medical scribe above in my presence, and it is both accurate and complete.    Lynden Ang, MD  [facility]

## 2020-07-21 NOTE — Nursing Note (Signed)
Allergies and smoking history reviewed. Screened for pain, and pharmacy verified.     Patient WAS wearing a surgical mask  universal precautions were followed when caring for the patient.   PPE used by provider during encounter: Surgical mask    I verified with patient, it is okay to leave a detailed message regarding confidential medical information on [phone].

## 2020-07-21 NOTE — Patient Instructions (Signed)
Skin Biopsy    How do I take care of the biopsy site?    For a sutured site: leave the original dressing in place for 24 hours. After 24 hours, you may clean the area twice daily with mild soap or cleanser and water and pat dry. Apply a thin layer of Vaseline over the sutures and cover with a band - aid or a non - stick Telfa bandage as instructed. If the bandage becomes wet or soiled, remove it and clean the area as described above before applying a new bandage.    For a non - sutured site: leave the original dressing in place till the following day. If a small a small amount of drainage is encountered, follow the above procedure (i.e. sutured site care) until the area is smooth to the touch (usually less than one week.    If bleeding occurs:     Apply steady pressure for 15 minutes.   If bleeding persists, call your physician immediately.    Infection    It is common to have slight redness and swelling at the biopsy site. However, you should contact your physician immediately if any of the following signs occur:     Increased redness or swelling, drainage, pain or feeling of warmth at the biopsy sites.   Red streaks leading away from the wound   Fever, chills    How are the sutures removed?    Some sutures are "absorbable" and do not require removal. Most do require removal and the physician or nurse will do so and evaluate the wound healing process at 1 to 2 weeks following the biopsy.    How do I find out about my results?    Biopsy results can be discussed over the telephone or during a follow up visit. Please, before you leave the office, make sure you understand the arrangements that have been made to give you your results.    Biopsy results typically require 5 to 7 days after the biopsy to reach your dermatologist. If you haven't received your results after approximately two weeks, please call [phone], and press option 4.

## 2020-07-23 LAB — DERMATOLOGY PATHOLOGY

## 2020-11-24 ENCOUNTER — Encounter: Payer: Self-pay | Admitting: Dermatology

## 2020-11-24 ENCOUNTER — Ambulatory Visit: Payer: BLUE CROSS/BLUE SHIELD | Admitting: Dermatology

## 2020-11-24 DIAGNOSIS — L821 Other seborrheic keratosis: Secondary | ICD-10-CM

## 2020-11-24 DIAGNOSIS — D2272 Melanocytic nevi of left lower limb, including hip: Secondary | ICD-10-CM

## 2020-11-24 DIAGNOSIS — D485 Neoplasm of uncertain behavior of skin: Secondary | ICD-10-CM

## 2020-11-24 NOTE — Progress Notes (Signed)
Follow-up Visit 11/24/2020    Reason for Visit: Lesion on left sole follow-up, new lesion on forehead     History of Present Illness:     Samantha Stokes is an 48yr female who presents today for a follow-up regarding acral nevus on her left sole. Pt was initially evaluated in clinic for this during August and was recommended to monitor the lesion. Today, patient is unsure if lesion is changing in size or color.  No associated symptoms.     Of note, patient reports a new lesion on her forehead. Present for an unknown amount of time. Asymptomatic, and no prior treatment.       Past Medical History:  1.  Negative for skin cancer     Past Medical History:   Diagnosis Date    Gastroparesis          Family History:  1.  Negative for melanoma     Family History   Adopted: Yes         Social History:  1.  Tobacco use: None  2.  Marital Status: Married      Medications and Allergies: reviewed by MD today      Review of Systems: The patient denies fever, fatigue, weight loss, cough, headache, night sweats, or chills.        Physical Examination:    General:  Appears comfortable, well-developed, well-nourished, and pleasant with normal affect.  Neuro:  Alert and oriented to person, time, and place    Cutaneous:      Focused exam of the left sole, face were notable for the following:     The pertinent findings are as listed below:    Left sole: 6 mm x 4 mm light brown macule with parallel furrow pattern on dermoscopy     Left forehead: brown to tan keratotic macule      Assessment / Plan:     1.  Acral Nevus----Left sole    - Has reassuring pattern on dermoscopy at today's visit  - However, given pt's reported history of change and difficult area for her to monitor, we did discuss biopsy today to exclude atypical melanocytic neoplasm  - The patient prefers to monitor  - Measurements and photographs were obtained today  - Pt to return to clinic in 6 months; if any change in size or appearance at that time, will biopsy  - Will also  plan for full body skin check at that time      2. Macular Seborrheic keratosis----Left foreahead    - Benign nature of the lesion discussed and reassurance given. No treatment indicated at this time.       Follow up in 6 months      Patient WAS wearing a surgical mask  Contact and Droplet precautions were followed when caring for the patient.   PPE used by provider during encounter: Surgical mask and Face Shield/Goggles      SCRIBE DISCLAIMER:  I, Patria Mane, SCRIBE, am personally taking down the notes in the presence of Dr. Lynden Ang, MD.  Electronically signed by - Patria Mane, [place]  11/24/2020  1:47 PM       PROVIDER DISCLAIMER:  Cyndia Bent, MD, personally performed the services described in this documentation, as scribed by the trained medical scribe above in my presence, and it is both accurate and complete.    Lynden Ang, MD  [facility]

## 2020-11-25 NOTE — Nursing Note (Signed)
11/24/2020    Identified patient by name and DOB, screened for pain, mobility assessment, reviewed allergies and verified pharmacy. Vitals not taken per provider's request.    Patient WAS wearing a surgical mask  Droplet precautions were followed when caring for the patient.   PPE used by provider during encounter: Surgical mask and Face Shield/Goggles    I verified with patient; it is okay to leave a detailed message regarding confidential medical information on [phone] (M).    Dairl Ponder, LVN

## 2021-06-01 ENCOUNTER — Ambulatory Visit: Payer: BLUE CROSS/BLUE SHIELD | Admitting: Dermatology

## 2021-06-01 ENCOUNTER — Encounter: Payer: Self-pay | Admitting: Dermatology

## 2021-06-01 DIAGNOSIS — D485 Neoplasm of uncertain behavior of skin: Secondary | ICD-10-CM

## 2021-06-01 DIAGNOSIS — D2372 Other benign neoplasm of skin of left lower limb, including hip: Secondary | ICD-10-CM

## 2021-06-01 DIAGNOSIS — L820 Inflamed seborrheic keratosis: Secondary | ICD-10-CM

## 2021-06-01 DIAGNOSIS — D492 Neoplasm of unspecified behavior of bone, soft tissue, and skin: Secondary | ICD-10-CM

## 2021-06-01 NOTE — Patient Instructions (Signed)
Skin Biopsy    How do I take care of the biopsy site?    For a sutured site: leave the original dressing in place for 24 hours. After 24 hours, you may clean the area twice daily with mild soap or cleanser and water and pat dry. Apply a thin layer of Vaseline over the sutures and cover with a band - aid or a non - stick Telfa bandage as instructed. If the bandage becomes wet or soiled, remove it and clean the area as described above before applying a new bandage.    For a non - sutured site: leave the original dressing in place till the following day. If a small a small amount of drainage is encountered, follow the above procedure (i.e. sutured site care) until the area is smooth to the touch (usually less than one week.    If bleeding occurs:     Apply steady pressure for 15 minutes.   If bleeding persists, call your physician immediately.    Infection    It is common to have slight redness and swelling at the biopsy site. However, you should contact your physician immediately if any of the following signs occur:     Increased redness or swelling, drainage, pain or feeling of warmth at the biopsy sites.   Red streaks leading away from the wound   Fever, chills    How are the sutures removed?    Some sutures are "absorbable" and do not require removal. Most do require removal and the physician or nurse will do so and evaluate the wound healing process at 1 to 2 weeks following the biopsy.    How do I find out about my results?    Biopsy results can be discussed over the telephone or during a follow up visit. Please, before you leave the office, make sure you understand the arrangements that have been made to give you your results.    Biopsy results typically require 5 to 7 days after the biopsy to reach your dermatologist. If you haven't received your results after approximately two weeks, please call [phone], and press option 4.

## 2021-06-01 NOTE — Progress Notes (Signed)
Follow-up Visit 06/01/2021    Reason for Visit: Lesion on left sole    History of Present Illness:     Samantha Stokes is an 15yr female who presents today for a follow-up regarding acral nevus on her left sole. Pt was initially evaluated in clinic for this during August and was recommended to monitor the lesion. Today, patient is unsure if lesion is changing in size or color.  No associated symptoms.    Also reports lesions on her right neck. Present for an unknown amount of time. Notes that lesion is bothersome and irritated to her. Patient would like treatment for these lesions today.      Past Medical History:  1.  Negative for skin cancer     Past Medical History:   Diagnosis Date    Gastroparesis          Family History:  1.  Negative for melanoma     Family History   Adopted: Yes         Social History:  1.  Tobacco use: None  2.  Occupation: Art therapist      Medications and Allergies: reviewed by MD today        Review of Systems: The patient denies fever, fatigue, weight loss, cough, headache, night sweats, or chills.        Physical Examination:    General:  Appears comfortable, well-developed, well-nourished, and pleasant with normal affect.  Neuro:  Alert and oriented to person, time, and place    Cutaneous:      Focused exam of the face, neck,  were notable for the following:     The pertinent findings are as listed below:    Left sole: 6 mm x 4 mmlight brown macule; edges appear more ill-defined compared to prior photograph      Right lateral neck: Brown keratotic papule with underlying erythema       Assessment / Plan:     1.  Neoplasm of uncertain behavior of skin----Left sole    - Given that edges appear more ill-defined than prior exam, I recommended obtaining biopsy today to exclude atypical melanocytic neoplasm  - Recommend punch biopsy today; pt agreeable  - Written informed consent was obtained.  Benefits, alternatives, and risks of procedure (scar, bleeding, infection) were discussed.   Photograph was obtained.  The affected area was marked and prepped with alcohol.  Anesthesia:  0.5% lidocaine with epinephrine buffered with sodium bicarbonate.  Biopsy obtained through punch technique using 4 mm punch trephine.  Hemostasis:  4-0 chromic. Specimen was sent for histopathology.  Wound dressing was applied and wound care reviewed.     2. Irritated Seborrheic Keratoses----Right lateral neck    - Recommended treatment with liquid nitrogen as this is bothersome to patient; pt agreeable  - Counseled regarding risks including scar formation, dyspigmentation, potential for recurrence   - A total of 2 lesions were treated with liquid nitrogen; procedure was tolerated well; after-care reviewed         Will contact patient once results are available       Patient WAS wearing a surgical mask  Contact and Droplet precautions were followed when caring for the patient.   PPE used by provider during encounter: Surgical mask and Face Shield/Goggles      SCRIBE DISCLAIMER:  I, Patria Mane, SCRIBE, am personally taking down the notes in the presence of Dr. Lynden Ang, MD.  Electronically signed by - Patria Mane, [place]  06/01/2021  10:37  AM       PROVIDER DISCLAIMER:  I, Lynden Ang, MD, personally performed the services described in this documentation, as scribed by the trained medical scribe above in my presence, and it is both accurate and complete.    Lynden Ang, MD  [facility]

## 2021-06-07 LAB — DERMATOLOGY PATHOLOGY

## 2021-06-09 ENCOUNTER — Other Ambulatory Visit: Payer: Self-pay | Admitting: Dermatology

## 2021-06-09 DIAGNOSIS — D038 Melanoma in situ of other sites: Secondary | ICD-10-CM

## 2021-06-10 ENCOUNTER — Other Ambulatory Visit: Payer: BLUE CROSS/BLUE SHIELD

## 2021-06-10 ENCOUNTER — Encounter: Payer: Self-pay | Admitting: Dermatology

## 2021-06-10 ENCOUNTER — Telehealth: Payer: Self-pay | Admitting: Dermatology

## 2021-06-10 ENCOUNTER — Ambulatory Visit: Payer: BLUE CROSS/BLUE SHIELD | Attending: Dermatology | Admitting: Dermatology

## 2021-06-10 DIAGNOSIS — D0372 Melanoma in situ of left lower limb, including hip: Secondary | ICD-10-CM | POA: Insufficient documentation

## 2021-06-10 DIAGNOSIS — Z5189 Encounter for other specified aftercare: Secondary | ICD-10-CM | POA: Insufficient documentation

## 2021-06-10 NOTE — Telephone Encounter (Signed)
Dr. Lanora Manis,    Please see message below sent by patient.Dairl Ponder, LVN    Janett Billow - May you please schedule patient a 3 month follow-up full skin check. Dairl Ponder, LVN

## 2021-06-10 NOTE — Telephone Encounter (Signed)
From: Reginia Naas  To: Nathanial Rancher, MD  Sent: 06/09/2021 12:37 PM PDT  Subject: Follow-Up Procedure Status    Hello Dr. Lanora Manis,   You had called me earlier this week with the results of my biopsy and that I would need an additional procedure for which the referring office would call and schedule. I haven't received any calls to get that appointment scheduled.     Also, I haven't received any call from the main office for my 3 month post-check. Do I need to call and get those scheduled?     Thank you.

## 2021-06-10 NOTE — Nursing Note (Signed)
Identified patient by name and DOB, screened for pain, mobility assessment, reviewed allergies and verified pharmacy. Vitals not taken per provider's request.    Patient WAS wearing a surgical mask  Droplet precautions were followed when caring for the patient.   PPE used by provider during encounter: Surgical mask and Face Shield/Goggles    I verified with patient;   It is okay to leave a detailed message regarding confidential medical information   on phone number: [phone]     Or on mychart    Doroteo Glassman, MA

## 2021-06-10 NOTE — Progress Notes (Signed)
Date of Visit:  06/10/2021    Reason for Visit:  Biopsy site wound check       History of Present Illness    The patient is a pleasant 55 year-old woman who presents today for biopsy site check.  The patient underwent punch biopsy of a pigmented macule on the left sole on 06/01/2021.  Histopathology showed melanoma in situ and Mohs surgery is pending.  Pt notes this has been painful and difficult to walk on the past few days.  Denies any redness or swelling of the affected area.  She states that she saw "pus" on the bandaid yesterday.  She switched from Vaseline to Neosporin over past couple of days.  She has been doing a lot of walking and is active.      Physical Examination    Focused exam of the left foot was notable for the following:    Involving the left sole, there is an intact, sutured punch biopsy site with minimal erythema.  There is no warmth or edema; no discharge appreciated.       Assessment / Plan    1.  Healing Biopsy Site---Left Sole    - Advised pt that site appears to be healing well  - Do not suspect infection at this time based on exam, but I did recommend culture for bacteria given reported history of "pus" from the site; this was sent today  - Advised pt to limit walking / activity as possible for the next week  - Discussed elevation of foot throughout the day and use of padding in shoes when walking   - Advised her to follow-up with any other concerns or if develops increased pain, redness, or discharge

## 2021-06-10 NOTE — Telephone Encounter (Signed)
Pt. Was scheduled to see Dr. Lanora Manis today.     Lupita Shutter, RN

## 2021-06-10 NOTE — Telephone Encounter (Signed)
HIPAA/3x identifiers verified. (Name, DOB, address, and/or last 4 of SSN        Request: Patient was instructed to call in if the wound from the bx looks concerning.     Symptoms:   -pussy  -hurts  -a week of hurting         Contact number:  [phone]            Please advise.      Thank you,    [facility]   Dermatology

## 2021-06-11 LAB — CULTURE AEROBIC

## 2021-06-12 LAB — CULTURE AEROBIC

## 2021-06-28 ENCOUNTER — Telehealth: Payer: Self-pay | Admitting: Dermatology

## 2021-06-28 NOTE — Telephone Encounter (Signed)
PRE- OP COMPLETED: 06/28/2021    I spoke with the patient regarding Slow Mohs Surgery with Dr. Gerlene Fee on 07/04/2021.  I Instructed the patient to arrive 15 minutes early for appointment  I informed the patient to eat and drink per patient's normal routine. (No Need to Stop eating or drinking prior to this procedure)  I informed the patient they are not to stop taking ANY of their medications prior to this procedure, unless the surgeon performing this procedure has instructed them to do so otherwise.  I informed the patient to expect to be here at least 1 hour(s) for procedure.   I informed the patient to Expect to have restrictions for 7 days after your surgery. Restrictions include: No heavy lifting, No bending at the waist, No exercising, no raising your blood pressure or heart rate. You are expected to take it easy and rest for 7 days after this surgery.       I encouraged the patient to call for any questions or concerns.     Diabetic: (Yes/No)  HIV History: no   Hepatitis history: no  Coumadin: no  Blood thinners: no  Does patient take any Benzodiazipine based medications such as:  Diazepam, Lorazepam, or Oxazepam.(Yes/No)no  If YES, patient told to bring medication to procedure. Patient will require a driver. Once consent form is signed with the physician, patient may take medication.   Patient is aware that if medication is taken prior to consent being signed surgery will be canceled: (Yes/No)  Antibiotic Prophylaxis Prior to dental work or surgeries due to artificial cardiac valve or endocarditis, or cardiac transplant? : no  Medical implants such as Pacemaker/defribillator/Neurostimulator: no  Medication allergies:yes,  Codeine, hydrocodone   Tobacco Use? no  Site: left sole ; Site was verbally verified with the patient.  Driver Required? (Yes/No) no      Samantha Stokes, LVN

## 2021-07-04 ENCOUNTER — Ambulatory Visit: Payer: BLUE CROSS/BLUE SHIELD | Attending: Dermatology | Admitting: Dermatology

## 2021-07-04 VITALS — BP 120/70 | HR 76

## 2021-07-04 DIAGNOSIS — D0372 Melanoma in situ of left lower limb, including hip: Secondary | ICD-10-CM | POA: Insufficient documentation

## 2021-07-04 MED ORDER — CEPHALEXIN 250 MG/5 ML ORAL SUSPENSION
500.0000 mg | INHALATION_SUSPENSION | Freq: Three times a day (TID) | ORAL | 0 refills | Status: AC
Start: 2021-07-04 — End: 2021-07-11

## 2021-07-04 NOTE — Nursing Note (Signed)
Post-op picture final layer/measurement taken? Yes   Post closure piture/measurement taken? yes  Dressing: Yes   Post-op BP Taken Yes   Wound care instructions given and patient understanding ?  Yes     Post Blade Count:1  Post Suture Count:1  Post Syringe Needle: 1  Post Bovie/Hyfrecator:1    Cathlean Marseilles, LVN

## 2021-07-04 NOTE — Patient Instructions (Signed)
Department of Dermatology Post-Operative Wound Care  Director of MOHS Surgery: Wille Glaser, MD  [address] [place], [place] [address]    A pressure dressing has been applied over your surgical wound. This will help prevent it from bleeding. Please keep it clean and dry, and leave it in place for 1-2 days.    Discomfort/swelling may be experienced following procedure:  Pain is typically worst the first night and quickly diminishes such that most have little discomfort by day 3 after the procedure. For any discomfort, you may take acetaminophen (Tylenol) and ibuprofen (Advil, Motrin), unless your physician has ordered other medication or told you in the past not to take these medications. The combination of both acetaminophen and ibuprofen can be nearly as effective as narcotic pain medicine. Generally, if you have kidney problems or take blood thinners, you should avoid ibuprofen. If you have liver problems, you should avoid acetaminophen.  Applying a cold pack 20 minutes on, 20 minutes off will help with discomfort and reduce swelling during the first 24 hours.   To reduce swelling, keep the wound above the level of your heart. If your surgical site is on your head, face or neck, sleep with your head and upper body elevated on a couple pillows.    Post-operative physical activity restrictions to reduce the risk of bleeding:  Though physical activity can be performed during the second intention healing, it will increase your risk of bleeding. Patients that bleed easily or are on prescription blood thinners should consider taking it easy for a week. The following can increase your risk of bleeding:  strenuous exercise  sexual activity  bending, straining, or lifting heavy objects       Follow these directions for non-sutured wound sites (second intent healing):  Wash hands with soap and water. Gently remove the bandage. The bandage may come off more easily if you wet it first.  Wash wound gently with  soap and water, and then rinse area. Pat the wound dry.  Apply Tegaderm dressing.(available over-the-counter at local drug stores). This dressing can last for one week duration before it needs to be changed. Fluid will collect underneath the Tegaderm, especially for the first 2 weeks following the procedure. This is normal. If it leaks out feel free to remove the dressing, clean the wound and apply another sheet.  Continue to reapply Tegaderm dressing which will provide a moist environment and promote wound healing, until the wound has completely healed. After you see a pink scar and no more open areas, you may stop. This may take up to 2 months or more.  Tegaderm dressing can get wet, and there is no problem showering with it on. Change out as needed if comes off before one week duration.                  If you do not have Tegaderm dressing:  Wash hands with soap and water. Gently remove the bandage. The bandage may come off more easily if you wet it first. Wash wound gently with soap and water, and then rinse area. Pat the wound dry.  Apply a layer of Vaseline or Aquaphor directly to the wound bed using a clean, new Q-tip. (Do not double dip).  Cover the wound with a Band-Aid or nonstick dressing and tape  Change the dressing once a day. During dressing changes, clean the wound with soap and water, apply Vaseline or aquaphor ointment to the wound as before.  Complications:  Bleeding- If your wound begins to bleed through the dressing at any time after surgery, apply direct, firm, constant pressure with clean washcloth on top of existing bandage for 30 minutes  Infection- Signs of infection include fever, redness, draining pus, excessive pain, swelling at incision site.  Suture reaction- This may occur 3-6 weeks after surgery and usually manifests as a painful red bump (pimple) filled with pus  If you experience any of the above complications please call the clinic at [phone] during office hours  Monday-Friday 8am-5pm.   After hours call [phone] and ask for the Dermatology Resident on call.    Nurses Signature Verbalized Understanding of Instructions: Samantha Stokes, [place]    07/04/21  Please Watch this video on how to care for your postoperative Mohs surgery site     [email or web address]

## 2021-07-04 NOTE — Nursing Note (Signed)
Medical staff introduced self when first meeting pt:   Yes    2 Identifiers used to identify pt- Name / Birth date: yes      Does patient state he/she has taken any pain medication in the last 8 hours no  Does patient state he/she has taken any Benzodiazipine based medications such as: Diazepam, Lorazepam, or Oxazepam, in the last 8 hours no  Does patient take Aspirin /Vitamin E? no   Is patient on anticoagulants: no Last INRno onno  Does patient state that he/she requires antibiotics prior to having dental work? no  Does patient state he/she has any artificial implants/Catheters: no  Physician notified if yes for any above:  Ye sEisen  Has pt eaten?  This afternoon  Referring Physician documented?:  Yes   Histology / Diagnosis documented:  Yes  ,   Allergies checked:  Yes ,    Allergies   Allergen Reactions    Codeine Nausea/Vomiting    Hydrocodone-Acetaminophen Nausea/Vomiting     Photos present:  Laretta Alstrom   Surgery site agreed to by patient as left sole   Anesthetic labeled and available:  Yes  Lidocaine with Epinephrine Lot #   Vitals taken and charted:  Yes  , BP 135/72 (SITE: left arm, Orthostatic Position: sitting, Cuff Size: regular)   Pulse 82   Cathlean Marseilles, LVN      Patient WAS wearing a surgical mask  Contact and Droplet precautions were followed when caring for the patient.   PPE used by provider during encounter: Surgical mask and Face Shield/Goggles    Cathlean Marseilles, LVN          A "Surgical Timeout" was observed at 1348. At this time, the patient was asked to identify themselves by name and birth date, which was verified with the patient ID wrist band located on their wrist.  The surgical site was identified by the patient pointing at the site, and use of a photo taken during the original biopsy if available.  After confirmation, the site was  marked with a surgical skin marker,  verified by the surgical  team, present as follows, Dr. Gerlene Fee,  and Cathlean Marseilles, LVN  then with the patient as the  correct site.  At this point written consent was obtained. Cathlean Marseilles, LVN

## 2021-07-04 NOTE — Progress Notes (Signed)
Dermatology Surgery Clinic Note    CC: Skin cancer     We had the pleasure of seeing your patient, Kamari Bilek, in consultation for Mohs Micrographic Surgery.  As you know, Ms. 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  The patient states that this lesion has been present for many months and has been growing slowly.    Labs  Results for orders placed or performed in visit on 06/01/21   Dermatology Pathology   Result Value Ref Range    CASE REPORT       DERMATOLOGY PATHOLOGY                             Case: [id number]                                  Authorizing Provider:  Nathanial Rancher, MD   Collected:           06/01/2021 [place]              Ordering Location:     [facility]ermat[facility] [address] Received:            06/01/2021 [place]              Pathologist:           Tilman Neat,                                                                            MD                                                                           Specimen:    SKIN, LEFT SOLE                                                                            FINAL DIAGNOSIS         SKIN, LEFT SOLE, (PUNCH BIOPSY)   ATYPICAL JUNCTIONAL MELANOCYTIC PROLIFERATION, CONSISTENT WITH EARLY MELANOMA IN SITU, SEE NOTE.    Note: There are overlapping features with acral melanocytic nevus in this case, but the combined presence of moderate to severe atypia, single cell growth, and diffuse immunopositivity for PRAME (Preferentially expressed Antigen in MElanoma) favors early acral lentiginous melanoma in situ.    The tumor appears mostly removed but extends to peripheral biopsy edges. The biopsy base is uninvolved.    Melanoma in situ is classified as Stage 0 (pTis)     (AJCC 8th Ed.)  MICROSCOPIC DESCRIPTION       Sections show acral skin containing a portion of a junctional proliferation of mostly single melanocytes with moderately to focally markedly enlarged pleomorphic nuclei and prominent  nucleoli, focally more prominent that is around an acrosyringium with limited pagetoid growth.  Appropriate positive and negative controls were performed. The immunohistochemical test was developed and performance characteristics determined by the [facility]. It has not been cleared or approved by the [place] Food and Drug Administration. The FDA has determined that such clearance is not necessary. This test is used for clinical purposes. It should not be regarded as investigational or for research. This laboratory is certified under the [place] (CLIA-88) as qualified to perform high complexity clinical laboratory testing.      GROSS DESCRIPTION       Received in formalin, cylindrical skin segment, 0.4 x 0.4 cm, bisected, entirely submitted in 1 block. pt      CLINICAL INFORMATION       Acral nevus, rule out atypia  6 mm x 4 mm light brown macule      DISCLAIMER       My electronic signature is attestation that I have personally reviewed the submitted specimen(s) and the description and diagnosis on this report reflects this evaluation.          Past Medical History:   Past Medical History:   Diagnosis Date    Gastroparesis      HIV history: no  Hepatitis history: no    Blood thinners: no  Antibiotic Prophylaxis: no  Medical implants: no  Anxiety about procedure: no    Medications:   Outpatient Encounter Medications as of 07/04/2021   Medication Sig Dispense Refill    Cephalexin (KEFLEX) 250 mg/5 mL Liquid Take 10 mL by mouth 3 times daily for 7 days. 210 mL 0    Omeprazole (PRILOSEC) 20 mg Delayed Release Capsule Take 1 capsule by mouth once daily before a meal. PLEASE SEE ATTACHED FOR DETAILED DIRECTIONS 90 capsule 0    Progesterone Micronized (PROMETRIUM) 200 mg capsule Take 200 mg by mouth every day. in the evening      Ranitidine (ZANTAC) 150 mg Tablet Take 1 tablet by mouth if needed. (acid) 60 tablet 1     No facility-administered encounter  medications on file as of 07/04/2021.         Codeine    Nausea/Vomiting  Hydrocodone-Acetaminophen    Nausea/Vomiting    We examined the biopsy site(s), which the patient pointed out, and found a ill defined papule on the above specified location. We marked the area(s) with a surgical marking pen and the patient agreed to the location of the lesion.  The patient's vital signs were noted. The patient appeared to be oriented to time, place, and person. We explained the nature of this skin cancer and the potential advantages of the Mohs technique.  We described the various steps involved in the Mohs procedure and explained alternative treatments, such as excision, topical imiquimod, and radiation therapy.      We discussed in detail the potential options for reconstruction following removal of the cancer. These included: secondary intention. I drew out the proposed options using a pen and paper for the patient to see. We also discussed the certainty of scar and risks of the procedure including bleeding, infection, nerve damage, failure to cure the tumor, and failure of the reconstruction.  I explained that the patient would not be  allowed to do any exercise or perform strenuous activities for at least one week. The patient stated that she understood.          In summary, we agree with you that the high-risk location and clinically ill-defined margins of this tumor are indications for Mohs micrographic surgery.     Thank you very much for this consultation.  Lorette Ang, MD    Slow Mohs Surgery with Permanent H & E and MITF immunostain: Operative Report   Case Number: [id number]   Surgeon: Alexis Goodell. Gerlene Fee, MD, Drue Flirt, MD  Assistants: Jacqualine Code, MA   Anesthesia Type: 0.5% buffered Lidocaine with 1:200,000 epinephrine   Anesthesia amount: 9   Indication: Ill defined, skin cancer  Pre Op Diagnosis: Melanoma in Situ   Post Op Diagnosis: Melanoma in Situ  Number of Specimens per Layer: 1/1  Number or Blocks per  Layer: 3/1  Path Number: [id number]   Pre Operative width: 0.8 cm   Pre Operative length: 0.8 cm   Mohs Layer: 1   Location: left sole   Pre Operative Medications: See EMR Entry.   Post Operative Medications: See EMR Entry   Subcuticular Suture Material: None   Cuticular Suture Material: 4-0 vicryl rapide    Description of Procedure:   The Patient was brought to the out-patient operatory shortly after their appointment time.   A consent form was signed and all questions regarding the surgery were answered.  The patient was aware that they would heal with a scar, that there was a risk of bleeding, possibility of spread of the melanoma or recurrence despite surgery, infection and the possibility that another procedure might be necessary in the future if the tumor were to recur or the wound did not heal as anticipated.  Prior to the surgery treatment alternatives were discussed at length with the  patient, either during the consult or just before the surgery.  Prior to the procedure the medical record was reviewed by the physician. A pre-procedure checklist in the presence of the patient was reviewed. A surgery " timeout " was observed.  The following were confirmed during the surgery timeout: patient name, confirmation of consent, patient position, allergies, type of anesthesia, and antibiotic given (if any, see emr entry for any medications).  The area was broadly cleansed with chlorhexidine and allowed to dry.  Local anesthesia specified in the summary above, was infiltrated into the operative area.  Sterile drapes were placed around the operative site and A Bard-Parker scalpel was then used to excise a saucer shaped piece of tissue around the edges and under the base of the lesion.  Effort was made to remove as little normal appearing tissue as possible, while still assuring an adequate size for histopathology.     Small nicks were made in the surrounding skin to correspond with division of tissue into blocks for  histopathology sectioning.  The specimen was then oriented on a cassette.  The number of blocks and current Mohs level number is stated in the summary above.  Hemostasis was obtained with a combination of direct pressure and electrocautery.  A pressure bandage was applied to the surgical wound and the patient was returned to the waiting area. The tissue was then mapped and marked with the corresponding dyes(see patient's Mohs map).  Horizontal sections from the outer and under surface of the blocks were then ordered with standard hematoxylin and Eosin as well as MITF immunohistochemical stains to better ascertain the presence of tumor  at the margins.  When the slides become available I will interpret them and inform the patient about the results.     The findings from the procedure were as follows:  After Extirpation of the lesion the defect extended full thickness through the skin and into the deep subcutaneous plane. The areas where the tissue was scored were sutured with the suture material specified in the summary in case another layer is necessary.     The Patient tolerated the procedure well with very limited blood loss.  There were no intraoperative complications.  A firm pressure dressing was applied over petrolatum oitment and Telfa.  Strict instructions on postoperative wound care were supplied verbally and a wound care hand out was also given.  Arrangements were made for follow up.  The patient was instructed to call us at anytime for signs of wound infection, bleeding or any other concerns they might have regarding the surgery or the healing process.    This patient was seen and discussed with my attending, Dr. Gerlene Fee.    Drue Flirt, MD  PGY-4  Department of Dermatology  [employer] [facility] [employer]    This patient was seen, evaluated, and care plan was developed with the resident/fellow. I agree with the findings and plan as outlined in the resident's note above. I was present during the entire  procedure and visit.    Patient WAS wearing a surgical mask  Droplet precautions were followed when caring for the patient.   PPE used by provider during encounter: Face Shield/Goggles      Name: Wille Glaser, MD  Credentials/Training Level: Professor of Clinical Dermatology  Note Electronically Signed By: Alexis Goodell. Gerlene Fee, MD  PI# [id number]

## 2021-07-05 ENCOUNTER — Ambulatory Visit: Payer: Self-pay | Admitting: Dermatology

## 2021-07-05 ENCOUNTER — Telehealth: Payer: Self-pay | Admitting: Dermatology

## 2021-07-05 ENCOUNTER — Encounter: Payer: Self-pay | Admitting: Dermatology

## 2021-07-05 DIAGNOSIS — D039 Melanoma in situ, unspecified: Secondary | ICD-10-CM

## 2021-07-05 NOTE — Telephone Encounter (Signed)
HIPAA/3x identifiers verified. (Name, DOB, address, and/or last 4 of SSN        Request:     Patient calling to check status o amoxicillin rx?        Contact number:    [phone]        Please advise.      Thank you,    [facility]

## 2021-07-05 NOTE — Telephone Encounter (Signed)
Contacted patient at [phone].  Patient requesting for a prescription of Amoxicillin because she tolerates it better than Keflex. Informed patient that request will be forwarded to provider.      Conan Bowens, RN

## 2021-07-05 NOTE — Telephone Encounter (Signed)
From: Reginia Naas  To: Bing Quarry, MD  Sent: 07/05/2021 12:29 PM PDT  Subject: Prescription    Hi Dr. Gerlene Fee,     I just called [facility] and asked if my rx was ready and they said it was for an antibiotic I hadn't had before. Is there any way I could get an RX for amoxicillin in suspension? It doesn't give me any side effects and the pharmacist said it does the same thing as the one that was prescribed.     Thank you.  Samantha Stokes

## 2021-07-05 NOTE — Telephone Encounter (Signed)
I called the patient and left a voicemail notifying her that margins were clear. She will not require reconstruction at this site, and the wound should heal by secondary intent. I gave her our clinic call back number [phone] in case she had any questions or concerns.      Suezanne Cheshire, MD  Dermatology Resident, PGY-4  [place] Interpretation for Slow Mohs Case # [id number]  Appropriate positive and negative controls were performed. The controls performed as expected witht he positive control taking up stain and the negative stain not taking up any.  The immunohistochemical test(s) was developed and its performance characteristics determined by the [facility]. It has not been cleared or approved by the [place] Food and Drug Administration. The FDA has determined that such clearance is not necessary. This test is used for clinical purposes. It should not be regarded as investigational or for research. This laboratory is certified under the [place] (CLIA-88) as qualified to perform high complexity clinical laboratory testing.    All blocks were examined histologically by the surgeon.  Both the Hematoxalin and Eosin and MITF immunostains were interpreted.  There was no further evidence of the lesion at the specimen margins.  They are now considered lesion free.    Microscopic Description:   Block A  Pathologic pattern and cell morphology: Histologic examination of the tumor margin reveals normal cutaneous architecture and absence of atypical cellular morphology.  Presence of fibrosis: Scar and granulation tissue were present in this specimen  Presence of perineural invasion: none   Presence of confluent melanocytes: none   Presence of atypical melanocytes: none  Presence of pagetoid melanocytes: none     Block B  Pathologic pattern and cell morphology: Histologic examination of the tumor margin reveals normal cutaneous architecture and  absence of atypical cellular morphology.     Histologic depth of invasion: Not applicable .   Presence of fibrosis: none   Presence of perineural invasion: none   Presence of confluent melanocytes: none   Presence of atypical melanocytes: none   Presence of pagetoid melanocytes: none     Block C  Pathologic pattern and cell morphology: Histologic examination of the tumor margin reveals normal cutaneous architecture and absence of atypical cellular morphology.     Histologic depth of invasion: Not applicable .   Presence of fibrosis: none   Presence of perineural invasion: none   Presence of confluent melanocytes: none   Presence of atypical melanocytes: none   Presence of pagetoid melanocytes: none     Block D  Pathologic pattern and cell morphology: Histologic examination of the tumor margin reveals normal cutaneous architecture and absence of atypical cellular morphology.     Histologic depth of invasion: Not applicable .   Presence of fibrosis: none   Presence of perineural invasion: none     Block E  Pathologic pattern and cell morphology: Histologic examination of the tumor margin reveals normal cutaneous architecture and absence of atypical cellular morphology.     Histologic depth of invasion: Not applicable .   Presence of fibrosis: none   Presence of perineural invasion: none   Presence of confluent melanocytes: none   Presence of atypical melanocytes: none   Presence of pagetoid melanocytes: none     AJCC T stage: 0    The findings from the procedure were as follows:  After Extirpation of the lesion the defect extended full thickness  through the skin and into the deep subcutaneous plane.        Suezanne Cheshire, MD  Dermatology Resident, PGY-4  [place]

## 2021-07-05 NOTE — Telephone Encounter (Signed)
Thanks Concepcion, I got back to the patient :)

## 2021-07-06 ENCOUNTER — Ambulatory Visit: Payer: BLUE CROSS/BLUE SHIELD | Admitting: Dermatology

## 2021-07-06 ENCOUNTER — Encounter: Payer: Self-pay | Admitting: Dermatology

## 2021-07-06 ENCOUNTER — Other Ambulatory Visit: Payer: Self-pay | Admitting: Dermatology

## 2021-07-06 MED ORDER — AMOXICILLIN 400 MG/5 ML ORAL SUSPENSION
400.0000 mg | INHALATION_SUSPENSION | Freq: Three times a day (TID) | ORAL | 0 refills | Status: AC
Start: 2021-07-06 — End: 2021-07-13

## 2021-07-06 NOTE — Telephone Encounter (Signed)
Second voicemail left for patient. Mychart message with detailed instructions sent to patient.      Suezanne Cheshire, MD  Dermatology Resident, PGY-4  [place]

## 2021-07-06 NOTE — Telephone Encounter (Signed)
Called patient to notify the patient

## 2021-07-06 NOTE — Telephone Encounter (Signed)
Hi Dr. Gerlene Fee,    I received this message from our patient regarding post-op abx. We had sent for keflex suspension. She is requesting amoxicillin instead. I let her know that the 2 antibiotics cover for different organisms, but she would prefer amoxicillin as she can be sensitive to antibiotics and this med has not caused a problem in the past. My concern with the amoxicillin is the decreased staph coverage. Let me know your thoughts and I can advise the patient.    Thank you!    Drue Flirt, MD PGY-4  Department of Dermatology  [place] [facility] [place], [place]

## 2021-07-06 NOTE — Telephone Encounter (Signed)
Hello,     Patient called back for scripts of Amoxicillin.     She is still waiting for it to be called in for pick up.    [address]  [place]   Derm   [phone]

## 2021-07-07 ENCOUNTER — Ambulatory Visit: Payer: Self-pay | Admitting: Dermatology

## 2021-07-08 NOTE — Telephone Encounter (Signed)
Spoke with patient on phone 07/07/21. Noted that Dr. Gerlene Fee sent in amoxicillin, patient has picked this up and has started rx. No questions at this time.    Drue Flirt, MD PGY-4  Department of Dermatology  [place] [facility] [place], [place]

## 2021-07-13 ENCOUNTER — Encounter: Payer: Self-pay | Admitting: Dermatology

## 2021-07-13 NOTE — Telephone Encounter (Signed)
From: Reginia Naas  To: Bing Quarry, MD  Sent: 07/13/2021 10:22 AM PDT  Subject: Wound    Hello Surgical team,     I have been having pain with my wound. When I walk on my toes, it hurts, like it's pulling on the wound. I have attached a photo to make sure it is supposed to look like this. I do recall that it is not supposed to be painful.     Twice daily, I wash with soap and water, change bandages, and add lots of vaseline.     Is there anything I can do to help the healing? I purchased compression socks which should arrive today.     Please advise.   Thank you,

## 2021-07-13 NOTE — Telephone Encounter (Signed)
HIPAA/3x identifiers verified. (Name, DOB, address, and/or last 4 of SSN        Request: Patient is asking if she should come in.           Contact number: [phone]            Please advise.      Thank you,    [facility]   Dermatology

## 2021-07-14 ENCOUNTER — Telehealth: Payer: Self-pay | Admitting: Dermatology

## 2021-07-14 NOTE — Telephone Encounter (Signed)
Contacted patient at [phone] regarding left sole S/P MOHS surgery on 07/04/2021.  Patient is concerned regarding pain to site.  Patient taking Tylenol as needed for pain. Reviewed photo of site sent via my chart.  No signs of infection noted.  Patient denies active bleeding or purulent drainage. Patient states she is performing wound care BID, applying Vaseline to site and covering with clean bandage.  Patient reports pain to site only after walking. Explained to patient that due to area where wound is, pain is expected especially after walking.  Advised patient to continue wound care as instructed and to minimize bearing weight to site.  Patient reports that she also purchased compression stockings. Offered patient to come in for a nurse visit to discuss alterate wound care options, patient declined.  Advised patient to contact clinic for worsening symptoms such as constant pain to site, swelling, redness, increased drainage or bleeding. Patient verbalized understanding of instructions.      Conan Bowens, RN

## 2021-07-14 NOTE — Telephone Encounter (Signed)
Hi Angelina,     Generally I think the site looks like it's doing well, no erythema or purulence to indicate infection from this photo. Patient also had course of amoxicillin abx. Sometimes there can be some discomfort when walking or stretching the scar significantly, especially as new nerves are growing and forming. I'll let her know via MyChart message.    I am going to be at the [place] afternoon and Dr. Tamala Julian will be in clinic. If the patient wants to be seen in person, is it possible to see this patient as a nursing visit only for wound check? Or do one of the PAs have any availability for the wound check?    Thank you!    Drue Flirt, MD PGY-4  Department of Dermatology  [place] of [place], [place]

## 2021-07-15 NOTE — Telephone Encounter (Signed)
Thank you, Anderson Malta!

## 2021-08-08 NOTE — Telephone Encounter (Addendum)
Hello Dr. Gerlene Fee,  Please see New Message & pictures below. Thank You & Please Advise. Dairl Ponder, LVN

## 2021-08-08 NOTE — Telephone Encounter (Signed)
Thank you for replying to the patient, Samantha Stokes! I see she is scheduled for 08/12/21.    Drue Flirt, MD PGY-4  Department of Dermatology  [place] [facility] [place], [place]

## 2021-08-12 ENCOUNTER — Ambulatory Visit: Payer: BLUE CROSS/BLUE SHIELD | Attending: Dermatology | Admitting: Dermatology

## 2021-08-12 DIAGNOSIS — T148XXA Other injury of unspecified body region, initial encounter: Secondary | ICD-10-CM | POA: Insufficient documentation

## 2021-08-12 MED ORDER — CLOBETASOL 0.05 % TOPICAL OINTMENT
TOPICAL_OINTMENT | Freq: Once | TOPICAL | Status: AC
Start: 2021-08-12 — End: 2021-08-18

## 2021-08-12 MED ORDER — CLOBETASOL 0.05 % TOPICAL CREAM
TOPICAL_CREAM | TOPICAL | 0 refills | Status: AC
Start: 2021-08-12 — End: 2021-08-19

## 2021-08-12 NOTE — Nursing Note (Signed)
Pt. Here for wound check to the left sole of foot s/p Mohs done on 07/04/2021.     Pt. Presents with concerns of a raised " bump" growing out of the wound.     Old dressing was removed from site. Upon assessment, hypergranulation tissue seen to wound bed.     Pt. States area is still sore making it difficult to walk. No evidence of infection seen.     Wound was cleansed with chlorhexidine and photographs were taken. Pt. Was seen by Dr. Gerlene Fee who prescribed clobetasol ointment to be applied twice daily until it flattens out.     Verbal order received to apply clobetasol ointment one time. Ointment was applied and wound was covered with Telfa and secured with tape.     Pt. Tolerated well. Pt. Was instructed to inform us of any changes.     Pt. Was discharged in good condition.     Lupita Shutter, RN

## 2021-08-12 NOTE — Progress Notes (Signed)
Complains of tender lesion on left sole of foot.   Physical Exam: protuberant vascular tissue  Impression: hypergranulation tissue. Recommended clobetasol. Return if not resolved.   Orders Placed This Encounter    Clobetasol (TEMOVATE) 0.05 % Cream     Sig: Apply to left foot twice daily until tissue flattens     Dispense:  15 g     Refill:  0    Clobetasol (TEMOVATE) 0.05 % Ointment

## 2021-08-24 ENCOUNTER — Encounter: Payer: Self-pay | Admitting: Dermatology

## 2021-08-24 NOTE — Telephone Encounter (Signed)
From: Reginia Naas  To: Bing Quarry, MD  Sent: 08/24/2021 4:44 PM PDT  Subject: Wound Healing    Hello Dr. Gerlene Fee,     I was in the office on August 26 and you prescribed a cream for the overgrowth. I wanted to share the progression of the healing and how quickly the cream affected the wound.     Thank you!  Samantha Stokes

## 2021-08-29 MED FILL — clobetasoL 0.05 % topical ointment: TOPICAL | Qty: 30 | Status: CN

## 2021-09-13 ENCOUNTER — Ambulatory Visit: Payer: BLUE CROSS/BLUE SHIELD | Admitting: Dermatology

## 2021-09-13 ENCOUNTER — Encounter: Payer: Self-pay | Admitting: Dermatology

## 2021-09-13 DIAGNOSIS — Z85820 Personal history of malignant melanoma of skin: Secondary | ICD-10-CM

## 2021-09-13 DIAGNOSIS — D1801 Hemangioma of skin and subcutaneous tissue: Secondary | ICD-10-CM

## 2021-09-13 DIAGNOSIS — L821 Other seborrheic keratosis: Secondary | ICD-10-CM

## 2021-09-13 DIAGNOSIS — L905 Scar conditions and fibrosis of skin: Secondary | ICD-10-CM

## 2021-09-13 DIAGNOSIS — D229 Melanocytic nevi, unspecified: Secondary | ICD-10-CM

## 2021-09-13 DIAGNOSIS — D1800 Hemangioma unspecified site: Secondary | ICD-10-CM

## 2021-09-13 DIAGNOSIS — Z86006 Personal history of melanoma in-situ: Secondary | ICD-10-CM

## 2021-09-13 NOTE — Progress Notes (Deleted)
Visit Date: 09/13/2021, last seen on 07/04/2021 by Dr. Lanora Manis for Moh's of MIS on left sole.   Last seen in clinic on 06/01/2021 by Dr. Lanora Manis.    Follow up visit for Samantha Stokes (DOB: Nov 05, 1966), a 55yr old female  Chief complaint:  ***   History obtained from: Patient  Skin Cancer History: Melanoma in situ s/p Moh's 06/2021    R/O  HX:  PE:   AP:     Scar. History of melanoma of the left sole s/p Moh's in 06/2021  HX: No fever, chills, weight loss or fatigue. Site is well healed and asymptomatic.   PE: Well healed scar without evidence of recurrence. No lymphadenopathy  AP: The nature of sun-induced photo-aging and skin cancers is discussed.  Sun avoidance, protective clothing, and the use of 30-SPF sunscreens is advised. Observe closely for skin damage/changes, and call if such occurs.    Patient Fitzpatrick's Skin Type: ***     Return Visit: ***      Family History: There is not a family history of melanoma skin cancer.    Social History: The patient does not smoke or use tobacco products.  Occupation: Art therapist    Meds:  List in [place] viewed today    Allergies:    List in [place] viewed today    Labs Reviewed:   DERM LAB LIST 1:23200    Records Reviewed:   06/01/2021 Dermatology pathology report      SKIN, LEFT SOLE, (PUNCH BIOPSY)               ATYPICAL JUNCTIONAL MELANOCYTIC PROLIFERATION, CONSISTENT WITH EARLY MELANOMA IN SITU, SEE NOTE.     Note: There are overlapping features with acral melanocytic nevus in this case, but the combined presence of moderate to severe atypia, single cell growth, and diffuse immunopositivity for PRAME (Preferentially expressed Antigen in MElanoma) favors early acral lentiginous melanoma in situ.     The tumor appears mostly removed but extends to peripheral biopsy edges. The biopsy base is uninvolved.     Melanoma in situ is classified as Stage 0 (pTis)     (AJCC 8th Ed.)     Areas Examined:   Eliezer Champagne IEPPIR:[id number]  Last FBSE:  07/21/2020    Patient Protective Equipment (PPE) Used:  Patient WAS wearing a surgical mask  Contact and Droplet precautions were followed when caring for the patient.   PPE used by provider during encounter: N95 mask   DERM MASK REMOVED?:23439    SCRIBE DISCLAIMER:   ***    Time Spent Today on Evaluation and Management Service:    > includes pre-visit chart review, post visit documentation, etc.   > Excludes procedural time   DERM TIME VERBIAGE:23348     ***

## 2021-09-13 NOTE — Patient Instructions (Addendum)
For your skin protection:  Recommend using sun protection such as sunscreen 30 or higher, and sun protective clothing.

## 2021-09-13 NOTE — Nursing Note (Signed)
Identified patient by name and DOB, screened for pain, mobility assessment, reviewed allergies and verified pharmacy. Vitals not taken per provider's request.    Patient WAS wearing a surgical mask  Droplet precautions were followed when caring for the patient.   PPE used by provider during encounter: Surgical mask and Face Shield/Goggles    I verified with patient;   It is okay to leave a detailed message regarding confidential medical information   on phone number: [phone]  Or on mychart    Samantha Stokes

## 2021-09-13 NOTE — Progress Notes (Signed)
Visit Date: 09/13/2021, last seen on 07/04/2021 by Dr. Lanora Manis for Moh's of MIS on left sole.   Last seen in clinic on 06/01/2021 by Dr. Lanora Manis.    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 History of melanoma of the left sole s/p Moh's in 06/2021  HX: No fever, chills, weight loss or fatigue. Site is well healed and asymptomatic. Denies difficulty breathing, chest pain, upset stomach, constipation, new cutaneous lesions or masses beneath the skin. Denies weight changes, night sweats.  PE: Well healed scar without evidence of recurrence. No axillary or cervical lymphadenopathy  AP: The nature of sun-induced photo-aging and skin cancers is discussed.  Sun avoidance, protective clothing, and the use of 30-SPF sunscreens is advised. Observe closely for skin damage/changes, and call if such occurs.  -reviewed ABCDEs of melanoma    Nevi    Hx:  Multiple nevi, duration uncertain, denies any significant new or changing lesions, No associated bleeding or crusting    Exam:   Multiple nevi.  All are uniform, symmetric, similar to each other.  Nothing suspicous under dermatoscope.  Assessment:   Benign nevi.  None suspicious    Plan:   Benign, reassured, no treatment required.  Advised to report any changes.     Cherry angiomas  Hx:  Asymptomatic.    Exam:  Multiple small bright red dome-shaped papules scattered on trunk and back   Assessment:  Cherry angiomas.  Benign nature discussed  Plan:  No treatment required    Seborrheic keratoses  Hx:  Asymptomatic lesions, duration uncertain  Exam:  Waxy keratotic papules on the back, right buttocks  Assessment:  Seborrheic keratoses, benign. Reassured  Plan:  No treatment required    Return Visit: 3 months      Family History: There is not a family history of melanoma skin  cancer.    Social History: The patient does not smoke or use tobacco products.  Occupation: Art therapist    Meds:  List in [place] viewed today    Allergies:    List in [place] viewed today    Records Reviewed:   06/01/2021 Dermatology pathology report      SKIN, LEFT SOLE, (PUNCH BIOPSY)               ATYPICAL JUNCTIONAL MELANOCYTIC PROLIFERATION, CONSISTENT WITH EARLY MELANOMA IN SITU, SEE NOTE.     Note: There are overlapping features with acral melanocytic nevus in this case, but the combined presence of moderate to severe atypia, single cell growth, and diffuse immunopositivity for PRAME (Preferentially expressed Antigen in MElanoma) favors early acral lentiginous melanoma in situ.     The tumor appears mostly removed but extends to peripheral biopsy edges. The biopsy base is uninvolved.     Melanoma in situ is classified as Stage 0 (pTis)     (AJCC 8th Ed.)     Areas Examined:  full body, patient declined examination of breasts and genitalia    Last FBSE: 07/21/2020    Patient Protective Equipment (PPE) Used:  Patient WAS wearing a surgical mask  Contact and Droplet precautions were followed when caring for the patient.   PPE used by provider during encounter: N95 mask  Patient mask removed for exam    Jerel Shepherd, MD  [facility], PGY-2    This patient was  seen, evaluated, and care plan was developed with the resident.  I agree with the assessment and plan as outlined in the resident's note.  Report electronically signed by Tama Gander, MD. Attending

## 2021-09-21 MED FILL — clobetasoL 0.05 % topical ointment: TOPICAL | Qty: 15 | Status: AC

## 2021-11-03 ENCOUNTER — Encounter: Payer: Self-pay | Admitting: Dermatology

## 2021-11-03 ENCOUNTER — Ambulatory Visit: Payer: BLUE CROSS/BLUE SHIELD | Admitting: Dermatology

## 2021-11-03 DIAGNOSIS — K1379 Other lesions of oral mucosa: Secondary | ICD-10-CM

## 2021-11-03 DIAGNOSIS — Z86006 Personal history of melanoma in-situ: Secondary | ICD-10-CM

## 2021-11-03 DIAGNOSIS — L905 Scar conditions and fibrosis of skin: Secondary | ICD-10-CM

## 2021-11-03 DIAGNOSIS — Z85828 Personal history of other malignant neoplasm of skin: Secondary | ICD-10-CM

## 2021-11-03 NOTE — Nursing Note (Signed)
Identified patient by name and DOB, screened for pain, mobility assessment, reviewed allergies and verified pharmacy. Vitals not taken per provider's request.    Has pt fallen in the last 6 months:no  Has pt used assistive devices such as a walker, wheelchair, or cane: no    Patient WAS wearing a surgical mask  Contact and Droplet precautions were followed when caring for the patient.   PPE used by provider during encounter: Surgical mask  I verified with patient;   It is okay to leave a detailed message regarding confidential medical information   on [phone]  Or on Samantha Stokes, Samantha Stokes 11/03/2021 10:24 AM

## 2021-11-03 NOTE — Progress Notes (Signed)
Visit Date: 11/03/2021, last seen on 09/13/2021 by Dr. Ronalee Red    Follow up visit for Samantha Stokes (DOB: 08-20-1966), a 55yr old female  Chief complaint:  skin check   History obtained from: Patient  Skin Cancer History: Melanoma in situ s/p Moh's 06/2021    Scar with adjacent Bite fibroma  HX: Pt reports a spot on the lower lip present for a few weeks, asymptomatic with no prior treatment. No changes. Pt reports a bump on the inside of the lower lip next to the lesion described above. Seen by her dentist the other day who reassured her it was a salivary gland. She endorses biting her cheeks, tongue, or lip frequently when eating.  PE: Mcneary macule on the right lower lip vermilion, skin colored papule with indistinct borders on the right lower lip mucosa  AP: Favor scar secondary to trauma on right lower lip vermilion. Favor bite fibroma vs salivary gland.  - Reassured pt of benign appearance today.  - Discussed biopsy as gold standard of diagnosis vs trial of cryotherapy vs continued observation. Pt opts for continued observation, will reconsider biopsy or cryotherapy at next visit.  - Continue to monitor, call clinic if changing or symptomatic, will reevaluate at next visit.    Scar. History of melanoma of the left sole s/p Moh's in 06/2021  HX: No fever, chills, weight loss or fatigue. Site is well healed and asymptomatic. Denies difficulty breathing, chest pain, upset stomach, constipation, new cutaneous lesions or masses beneath the skin. Denies weight changes, night sweats.  PE: Well healed scar without evidence of recurrence. No axillary or cervical lymphadenopathy  AP: The nature of sun-induced photo-aging and skin cancers is discussed.  Sun avoidance, protective clothing, and the use of 30-SPF sunscreens is advised. Observe closely for skin damage/changes, and call if such occurs.  -reviewed ABCDEs of melanoma    Did not discuss  Nevi    Cherry angiomas  Seborrheic keratoses    Return Visit: January, as scheduled.  Or sooner if needed.      Family History: There is not a family history of melanoma skin cancer.    Social History: The patient does not smoke or use tobacco products.  Occupation: Art therapist    Meds:  List in [place] viewed today    Allergies:    List in [place] viewed today    Records Reviewed:   06/01/2021 Dermatology pathology report      SKIN, LEFT SOLE, (PUNCH BIOPSY)               ATYPICAL JUNCTIONAL MELANOCYTIC PROLIFERATION, CONSISTENT WITH EARLY MELANOMA IN SITU, SEE NOTE.     Note: There are overlapping features with acral melanocytic nevus in this case, but the combined presence of moderate to severe atypia, single cell growth, and diffuse immunopositivity for PRAME (Preferentially expressed Antigen in MElanoma) favors early acral lentiginous melanoma in situ.     The tumor appears mostly removed but extends to peripheral biopsy edges. The biopsy base is uninvolved.     Melanoma in situ is classified as Stage 0 (pTis)     (AJCC 8th Ed.)     Areas Examined:  problem-focused  Last FBSE: 09/13/2021    Patient Protective Equipment (PPE) Used:  Patient WAS wearing a surgical mask  Contact and Droplet precautions were followed when caring for the patient.   PPE used by provider during encounter: N95 mask  Patient mask removed for exam    SCRIBE DISCLAIMER:   I, [employer]  Logan, a trained medical scribe, scribed this noted in the presence of Dr. Tama Gander, MD.   Electronically signed by Carlynn Purl, 11/03/2021  1:13 PM    SCRIBE DISCLAIMER  I, Tama Gander, MD, personally performed the services described in this documentation, as scribed by the trained medical scribe above in my presence, and it is both accurate and complete.     Electronically signed by: Tama Gander, MD -  11/03/2021  1:53 PM

## 2021-11-08 ENCOUNTER — Ambulatory Visit: Payer: BLUE CROSS/BLUE SHIELD | Admitting: Physician Assistant

## 2021-12-20 ENCOUNTER — Ambulatory Visit: Payer: BLUE CROSS/BLUE SHIELD | Admitting: Dermatology

## 2021-12-20 DIAGNOSIS — D1801 Hemangioma of skin and subcutaneous tissue: Secondary | ICD-10-CM

## 2021-12-20 DIAGNOSIS — L905 Scar conditions and fibrosis of skin: Secondary | ICD-10-CM

## 2021-12-20 DIAGNOSIS — L821 Other seborrheic keratosis: Secondary | ICD-10-CM

## 2021-12-20 DIAGNOSIS — Z86006 Personal history of melanoma in-situ: Secondary | ICD-10-CM

## 2021-12-20 NOTE — Nursing Note (Signed)
Identified patient by name and DOB, screened for pain, mobility assessment, reviewed allergies and verified pharmacy. Vitals not taken per provider's request.    Has pt fallen in the last 6 months:no  Has pt used assistive devices such as a walker, wheelchair, or cane: no    Patient WAS wearing a surgical mask  Contact precautions were followed when caring for the patient.   PPE used by provider during encounter: Surgical mask  I verified with patient;   It is okay to leave a detailed message regarding confidential medical information   on  [phone]   Or on mychart    Rani Idler, MA

## 2021-12-20 NOTE — Progress Notes (Signed)
Visit Date: 12/20/2021, last seen on 11/03/2021 by Dr. Ronalee Red    Follow up visit for Samantha Stokes (DOB: May 16, 1966), a 56yr old female  Chief complaint:  skin check   History obtained from: Patient  Skin Cancer History: Melanoma in situ s/p Moh's 06/2021    Scar with adjacent Bite fibroma; chronic and stable  HX: Pt reports a spot on the lower lip present for a few weeks, asymptomatic with no prior treatment. No changes. Pt reports a bump on the inside of the lower lip next to the lesion described above. Seen by her dentist the other day who reassured her it was a salivary gland. She endorses biting her cheeks, tongue, or lip frequently when eating.  Interval 12/20/2021: This has been stable. Not changing or bothersome. Non-tender, asymptomatic.   PE: Delpriore macule on the right lower lip vermilion, skin colored papule with indistinct borders on the right lower lip mucosa; stable since prior  AP: Favor scar secondary to trauma on right lower lip vermilion. Favor bite fibroma vs salivary gland.  - Reassured pt of benign appearance today.  - Continue to monitor, call clinic if changing or symptomatic, will reevaluate at next visit.    Scar. History of melanoma in situ of the left sole s/p Moh's in 06/2021  HX: No fever, chills, weight loss or fatigue. Site is well healed and asymptomatic. Denies difficulty breathing, chest pain, upset stomach, constipation, new cutaneous lesions or masses beneath the skin. Denies weight changes, night sweats.  PE: Well healed scar without evidence of recurrence. No axillary or cervical lymphadenopathy  AP: The nature of sun-induced photo-aging and skin cancers is discussed.  Sun avoidance, protective clothing, and the use of 30-SPF sunscreens is advised. Observe closely for skin damage/changes, and call if such occurs.  -reviewed ABCDEs of melanoma    Cherry angiomas  Hx: Skin growths located on the trunk and extremities. First noticed months to years ago. Not growing in size. No bleeding,  ulceration, pain or itch. No prior treatment or biopsy.  Exam: pink-red, dome shaped vascular papules on the trunk and extremities  Assessment: Cherry angiomas, chronic and stable. Benign nature of these skin lesions was explained to the patient. No treatment is indicated at this time. The patient has been educated about self examination, and if changes occur including change in size,color or if lesions become symptomatic, the patient was instructed to return for follow up.   Plan:  - Continue to monitor.  - RTC if changing or become symptomatic.     Seborrheic keratoses  Hx: Skin growths located on the trunk and extremities. First noticed months to years ago. Some are growing in size. Occasionally itch, no bleeding, ulceration, or pain. No prior treatment or biopsy.   Exam: brown, verrucous, stuck-on papules on the face, trunk and extremities  Assessment:  Seborrheic keratoses, chronic and stable. Benign nature of these skin lesions was explained to the patient. No treatment is indicated at this time. The patient has been educated about self examination, and if changes occur including change in size,color or if lesions become symptomatic, the patient was instructed to return for follow up.   Plan:  - Continue to monitor.  - RTC if changing or become symptomatic.     Return Visit: 6 mos FBSE      Family History: There is unknown family history of melanoma skin cancer - patient was adopted.    Social History: The patient does not smoke or use tobacco products.  Occupation:  State employee    Meds:  List in EMR viewed today    Allergies:    List in EMR viewed today    Records Reviewed:   06/01/2021 Dermatology pathology report      SKIN, LEFT SOLE, (PUNCH BIOPSY)               ATYPICAL JUNCTIONAL MELANOCYTIC PROLIFERATION, CONSISTENT WITH EARLY MELANOMA IN SITU, SEE NOTE.     Note: There are overlapping features with acral melanocytic nevus in this case, but the  combined presence of moderate to severe atypia, single cell growth, and diffuse immunopositivity for PRAME (Preferentially expressed Antigen in MElanoma) favors early acral lentiginous melanoma in situ.     The tumor appears mostly removed but extends to peripheral biopsy edges. The biopsy base is uninvolved.     Melanoma in situ is classified as Stage 0 (pTis)     (AJCC 8th Ed.)     Areas Examined:  problem-focused  Last FBSE: today 12/20/2021     Patient Protective Equipment (PPE) Used:  Patient WAS wearing a surgical mask  Contact and Droplet precautions were followed when caring for the patient.   PPE used by provider during encounter: N95 mask  Patient mask removed for exam      This patient was seen and discussed with my attending, Dr. Ronalee Red.    Drue Flirt, MD  PGY-4  Department of Dermatology  [employer]    This patient was seen, evaluated, and care plan was developed with the resident.  I agree with the assessment and plan as outlined in the resident's note.  Report electronically signed by Tama Gander, MD. Attending

## 2021-12-20 NOTE — Progress Notes (Deleted)
Visit Date: 12/20/2021, last seen on 11/03/2021 by Dr. Ronalee Red    Follow up visit for Samantha Stokes (DOB: 31-May-1966), a 56yr old female  Chief complaint:  skin check   History obtained from: Patient  Skin Cancer History: Melanoma in situ s/p Moh's 06/2021    Scar with adjacent Bite fibroma  HX: Pt reports a spot on the lower lip present for a few weeks, asymptomatic with no prior treatment. No changes. Pt reports a bump on the inside of the lower lip next to the lesion described above. Seen by her dentist the other day who reassured her it was a salivary gland. She endorses biting her cheeks, tongue, or lip frequently when eating.  PE: Mcelvain macule on the right lower lip vermilion, skin colored papule with indistinct borders on the right lower lip mucosa  AP: Favor scar secondary to trauma on right lower lip vermilion. Favor bite fibroma vs salivary gland.  - Reassured pt of benign appearance today.  - Discussed biopsy as gold standard of diagnosis vs trial of cryotherapy vs continued observation. Pt opts for continued observation, will reconsider biopsy or cryotherapy at next visit.  - Continue to monitor, call clinic if changing or symptomatic, will reevaluate at next visit.    Scar. History of melanoma of the left sole s/p Moh's in 06/2021  HX: No fever, chills, weight loss or fatigue. Site is well healed and asymptomatic. Denies difficulty breathing, chest pain, upset stomach, constipation, new cutaneous lesions or masses beneath the skin. Denies weight changes, night sweats.  PE: Well healed scar without evidence of recurrence. No axillary or cervical lymphadenopathy  AP: The nature of sun-induced photo-aging and skin cancers is discussed.  Sun avoidance, protective clothing, and the use of 30-SPF sunscreens is advised. Observe closely for skin damage/changes, and call if such occurs.  -reviewed ABCDEs of melanoma    Did not discuss  Nevi    Cherry angiomas  Seborrheic keratoses    Return Visit: *** January, as  scheduled. Or sooner if needed.      Family History: There is not a family history of melanoma skin cancer.    Social History: The patient does not smoke or use tobacco products.  Occupation: Art therapist    Meds:  List in [place] viewed today    Allergies:    List in [place] viewed today    Records Reviewed:   06/01/2021 Dermatology pathology report      SKIN, LEFT SOLE, (PUNCH BIOPSY)               ATYPICAL JUNCTIONAL MELANOCYTIC PROLIFERATION, CONSISTENT WITH EARLY MELANOMA IN SITU, SEE NOTE.     Note: There are overlapping features with acral melanocytic nevus in this case, but the combined presence of moderate to severe atypia, single cell growth, and diffuse immunopositivity for PRAME (Preferentially expressed Antigen in MElanoma) favors early acral lentiginous melanoma in situ.     The tumor appears mostly removed but extends to peripheral biopsy edges. The biopsy base is uninvolved.     Melanoma in situ is classified as Stage 0 (pTis)     (AJCC 8th Ed.)     Areas Examined:  problem-focused  Last FBSE: 09/13/2021    Patient Protective Equipment (PPE) Used:  Patient WAS wearing a surgical mask  Contact and Droplet precautions were followed when caring for the patient.   PPE used by provider during encounter: N95 mask  Patient mask removed for exam    ***    ***

## 2022-06-06 ENCOUNTER — Ambulatory Visit: Payer: BLUE CROSS/BLUE SHIELD | Admitting: Dermatology

## 2022-06-06 ENCOUNTER — Encounter: Payer: Self-pay | Admitting: Dermatology

## 2022-06-06 DIAGNOSIS — L905 Scar conditions and fibrosis of skin: Secondary | ICD-10-CM

## 2022-06-06 DIAGNOSIS — L814 Other melanin hyperpigmentation: Secondary | ICD-10-CM

## 2022-06-06 DIAGNOSIS — D1801 Hemangioma of skin and subcutaneous tissue: Secondary | ICD-10-CM

## 2022-06-06 DIAGNOSIS — L821 Other seborrheic keratosis: Secondary | ICD-10-CM

## 2022-06-06 DIAGNOSIS — L918 Other hypertrophic disorders of the skin: Secondary | ICD-10-CM

## 2022-06-06 NOTE — Patient Instructions (Addendum)
Can search for clothing with sun protection, known as UPV clothing. Available in just sleeves.

## 2022-06-06 NOTE — Nursing Note (Signed)
Identified patient by name and DOB, screened for pain, mobility assessment, reviewed allergies and verified pharmacy. Vitals not taken per provider's request.    Has pt fallen in the last 6 months:no  Has pt used assistive devices such as a walker, wheelchair, or cane: no    Patient WAS wearing a surgical mask  Contact precautions were followed when caring for the patient.   PPE used by provider during encounter: Surgical mask  I verified with patient;   It is okay to leave a detailed message regarding confidential medical information   on  [phone]   Or on mychart    Kahmari Herard, MA

## 2022-06-06 NOTE — Progress Notes (Signed)
Visit Date: 06/06/2022   Last seen: 12/20/2021    Follow-up visit for Samantha Stokes (DOB: 12-31-1965), a 56yrold female.    Chief complaint:  Spots on chest    History obtained from: Patient    Dx: Scar. History of melanoma in situ of the left sole s/p Mohs in 06/2021  Hx: No fever, chills, weight loss or fatigue. Site is well healed and asymptomatic. Denies difficulty breathing, chest pain, upset stomach, constipation, new cutaneous lesions or masses beneath the skin. Denies weight changes, night sweats.  PE: Well healed scar without evidence of recurrence. No axillary or cervical lymphadenopathy.  Plan:   - NER  -The nature of sun-induced photo-aging and skin cancers is discussed.  Sun avoidance, protective UPV clothing, and the use of 30-SPF sunscreens is advised. Observe closely for skin damage/changes, and call if such occurs.  - Letter written for approval of tinted windows in vehicle, additional sun protection while driving.       Dx: Solar lentigines; chronic stable  HPI: Patient presents dark spots on skin present years. Asymptomatic, no prior treatment.   PE: Multiple brown reticulate macules consistent with lentigines on the hands.   Plan:   - Broad spectrum sunscreen use encouraged and sunscreen handout provided. Discussed cosmetic treatment options.  - The benign nature of these skin lesions was explained. No treatment is indicated at this time.    Dx: Cherry angioma; chronic stable  HPI: Patient reports red spots on skin present for months-years. Asymptomatic, no prior treatment.  PE: Scattered brightly erythematous, vascular papules on the trunk and extremities.   Plan:   - The benign nature of these skin lesions was explained to the patient. No treatment is indicated at this time.     Dx: Seborrheic keratoses; chronic stable  HPI: Patient presents brown spots with rough texture on skin present years. Asymptomatic, no prior treatment.  PE: Multiple waxy stuck-on papules on the trunk, chest, and right hand.    Plan:   - The benign nature of these skin lesions was explained to the patient. No treatment is indicated at this time. The patient has been educated about self examination, and if changes occur including change in size,color or if lesions become symptomatic, the patient was instructed to return for follow up.     Dx: Acrochordon  HPI: Patient reports flesh colored lesion on skin present months. Asymptomatic, no prior treatment.   PE: Small flesh colored skin colored pedunculated soft papule on right axilla.   Plan:   - Benign nature of lesion discussed. As currently asymptomatic no treatment needed.      Return Visit: 6 months     Problem List:   Patient Active Problem List    Diagnosis Date Noted    Actinic keratosis 04/29/2017     Priority: Low    Seborrheic keratoses 04/29/2017     Priority: Low    Hypothyroidism (acquired) 04/26/2018    Gastroparesis 02/16/2015      Skin Cancer Hx:   - MIS, left sole s/p Mohs 06/2021     Family Skin Cancer Hx: There is unknown family history of melanoma skin cancer - patient was adopted.    Meds:  List in EMR verified/updated with patient today    Allergies:    List in EMR verified/updated with patient today    Labs Reviewed: None     Records Reviewed:   Dermatology progress note from 12/20/2021    Areas Examined:  full body  Examination chaperoned by Earley Brooke, SCRIBE.    Patient Protective Equipment (PPE) Used:  Patient WAS wearing a surgical mask  Contact precautions were followed when caring for the patient.   PPE used by provider during encounter: Surgical mask and Gloves  Patient mask removed for exam    SCRIBE DISCLAIMER:   This note was scribed by Earley Brooke, SCRIBE, a trained medical scribe in the presence of Tama Gander, MD.  Electronically signed by Earley Brooke, SCRIBE, 06/06/2022 1:48 PM       SCRIBE DISCLAIMER  I, Tama Gander, MD, personally performed the services described in this documentation, as scribed by the trained  medical scribe above in my presence, and it is both accurate and complete.     Electronically signed by: Tama Gander, MD -  06/06/2022  2:16 PM

## 2022-11-28 ENCOUNTER — Ambulatory Visit: Payer: BLUE CROSS/BLUE SHIELD | Admitting: Dermatology

## 2022-11-28 DIAGNOSIS — L814 Other melanin hyperpigmentation: Secondary | ICD-10-CM

## 2022-11-28 DIAGNOSIS — L578 Other skin changes due to chronic exposure to nonionizing radiation: Secondary | ICD-10-CM

## 2022-11-28 DIAGNOSIS — D1801 Hemangioma of skin and subcutaneous tissue: Secondary | ICD-10-CM

## 2022-11-28 DIAGNOSIS — L821 Other seborrheic keratosis: Secondary | ICD-10-CM

## 2022-11-28 DIAGNOSIS — L820 Inflamed seborrheic keratosis: Secondary | ICD-10-CM

## 2022-11-28 DIAGNOSIS — Z86006 Personal history of melanoma in-situ: Secondary | ICD-10-CM

## 2022-11-28 NOTE — Progress Notes (Deleted)
Visit Date: 11/28/2022, last seen on 06/06/2022 by Dr. 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 Asymptomatic, no prior treatment.   PE: Multiple brown reticulate macules consistent with lentigines on the hands.   Plan:   - Broad spectrum sunscreen use encouraged and sunscreen handout provided. Discussed cosmetic treatment options.  - The benign nature of these skin lesions was explained. No treatment is indicated at this time.     Dx: Cherry angioma; chronic stable  HPI: Patient reports red spots on skin present for months-years. Asymptomatic, no prior treatment.  PE: Scattered brightly erythematous, vascular papules on the trunk and extremities.   Plan:   - The benign nature of these skin lesions was explained to the patient. No treatment is indicated at this time.      Dx: Seborrheic keratoses; chronic stable  HPI: Patient presents brown spots with rough texture on skin present years. Asymptomatic, no prior treatment.  PE: Multiple waxy stuck-on papules on the trunk, chest, and right hand.   Plan:   - The benign nature of these skin lesions was explained to the patient. No treatment is indicated at this time. The patient has been educated about self examination, and if changes occur including change in size,color or if lesions become symptomatic, the patient was instructed to return for follow up.      Dx: Acrochordon  HPI: Patient reports flesh colored lesion on skin present months. Asymptomatic, no prior treatment.   PE: Small flesh colored skin colored pedunculated soft papule on right axilla.   Plan:   - Benign nature of lesion discussed. As currently asymptomatic no treatment needed.    Scar. History of melanoma in situ of the left sole s/p Moh's in 06/2021  HX: No fever,  chills, weight loss or fatigue. Site is well healed and asymptomatic. Denies difficulty breathing, chest pain, upset stomach, constipation, new cutaneous lesions or masses beneath the skin. Denies weight changes, night sweats.  PE: Well healed scar without evidence of recurrence. No axillary or cervical lymphadenopathy  AP: The nature of sun-induced photo-aging and skin cancers is discussed.  Sun avoidance, protective clothing, and the use of 30-SPF sunscreens is advised. Observe closely for skin damage/changes, and call if such occurs.  -reviewed ABCDEs of melanoma    Return Visit: 6 months FBSE      Family History: There is unknown family history of melanoma skin cancer - patient was adopted.    Social History: The patient does not smoke or use tobacco products.  Occupation: SArt therapist   [place]  [place]    Allergies:    List in [place]    Records Reviewed:   06/01/2021 Dermatology pathology report      SKIN, LEFT SOLE, (PUNCH BIOPSY)               ATYPICAL JUNCTIONAL MELANOCYTIC PROLIFERATION, CONSISTENT WITH EARLY MELANOMA IN SITU, SEE NOTE.     Note: There are overlapping features with acral melanocytic nevus in this case, but the combined presence of moderate to severe atypia, single cell growth, and diffuse immunopositivity for PRAME (Preferentially expressed Antigen in MElanoma) favors early acral lentiginous melanoma in situ.     The tumor appears  mostly removed but extends to peripheral biopsy edges. The biopsy base is uninvolved.     Melanoma in situ is classified as Stage 0 (pTis)     (AJCC 8th Ed.)     Areas Examined:  problem-focused    ***    ***

## 2022-11-28 NOTE — Nursing Note (Signed)
Identified patient by name and DOB, screened for pain, mobility assessment, reviewed allergies and verified pharmacy. Vitals not taken per provider's request.    Has pt fallen in the last 6 months:no  Has pt used assistive devices such as a walker, wheelchair, or cane: no    Patient WAS wearing a surgical mask  Contact precautions were followed when caring for the patient.   PPE used by provider during encounter: Surgical mask  I verified with patient;   It is okay to leave a detailed message regarding confidential medical information   on  [phone]   Or on mychart    Durward Fortes, [place]

## 2022-11-28 NOTE — Progress Notes (Signed)
Visit Date: 11/28/2022, last seen on 06/06/2022 by Dr. Ronalee Red    Follow up visit for Samantha Stokes (DOB: December 13, 1966), a 56yrold female  Chief complaint: skin check  History obtained from: Patient  Skin Cancer History: Melanoma in situ s/p Moh's 06/2021      Dx: Irritated Seborrheic keratosis  HPI: Patient reports waxy spot with rough texture on skin present months, slightly red and scaly, Irritating  PE:  Waxy scaly stuck-on papule on the left forehead, erythematous base  Plan:   - Cryotherapy performed today.  LN2  x 1 left forehead  Verbal consent obtained.  (Risks discussed including but not limited to hyper- or hypo-pigmentation, scar, failure to resolve, possible need for additional procedure, pain, healing time, etc.)  Duration of freeze sufficient to achieve a slow thaw.        Dx: Seborrheic keratosis  HPI: Patient reports waxy spot with rough texture on skin present years, feels like getting bigger. Catches on her necklace  PE: Waxy stuck-on papule on the left chest  Assessment: chronic stable   Plan:   - The benign nature of these skin lesions was explained to the patient. No treatment is indicated at this time. The patient has been educated about self examination, and if changes occur including change in size,color or if lesions become symptomatic, the patient was instructed to return for follow up.   - Discussed that removal would be cosmetic, and be out-of-pocket cost. Pt defers at this time.      Dx: Photodamage  HPI: Patient presents increased tan of sun-exposed areas over the past several years. Asymptomatic, no prior treatment.  PE: Tanned skin over sun-exposed areas especially neck, arms, and legs  Assessment: new problem   Plan:   - Sun protection education was reviewed with the patient, including using a sunscreen with broadspectrum (UVA/UVB) protection with SPF of at least 30 and above, with frequent re-application.       Dx: Solar lentigines; chronic stable  HPI: Patient presents dark spots on skin  present years. Asymptomatic, no prior treatment.   PE: Multiple brown reticulate macules consistent with lentigines on the hands.   Plan:   - Broad spectrum sunscreen use encouraged and sunscreen handout provided. Discussed cosmetic treatment options.  - The benign nature of these skin lesions was explained. No treatment is indicated at this time.     Dx: Cherry angioma; chronic stable  HPI: Patient reports red spots on skin present for months-years. Asymptomatic, no prior treatment.  PE: Scattered brightly erythematous, vascular papules on the trunk and extremities.   Plan:   - The benign nature of these skin lesions was explained to the patient. No treatment is indicated at this time.        Scar. History of melanoma in situ of the left sole s/p Moh's in 06/2021  HX: No fever, chills, weight loss or fatigue. Site is well healed and asymptomatic. Denies difficulty breathing, chest pain, upset stomach, constipation, new cutaneous lesions or masses beneath the skin. Denies weight changes, night sweats.  PE: Well healed scar without evidence of recurrence. No axillary or cervical lymphadenopathy  AP: The nature of sun-induced photo-aging and skin cancers is discussed.  Sun avoidance, protective clothing, and the use of 30-SPF sunscreens is advised. Observe closely for skin damage/changes, and call if such occurs.  -reviewed ABCDEs of melanoma    Return Visit: 6 months FBSE      Family History: There is unknown family history of melanoma skin  cancer - patient was adopted.    Social History: The patient does not smoke or use tobacco products.  Occupation: Art therapist    Meds:  List in [place] viewed today    Allergies:    List in [place] viewed today    Records Reviewed:   06/01/2021 Dermatology pathology report      SKIN, LEFT SOLE, (PUNCH BIOPSY)               ATYPICAL JUNCTIONAL MELANOCYTIC PROLIFERATION, CONSISTENT WITH EARLY MELANOMA IN SITU, SEE NOTE.     Note: There are  overlapping features with acral melanocytic nevus in this case, but the combined presence of moderate to severe atypia, single cell growth, and diffuse immunopositivity for PRAME (Preferentially expressed Antigen in MElanoma) favors early acral lentiginous melanoma in situ.     The tumor appears mostly removed but extends to peripheral biopsy edges. The biopsy base is uninvolved.     Melanoma in situ is classified as Stage 0 (pTis)     (AJCC 8th Ed.)     Areas Examined:  problem-focused      Noreene Filbert, MD  Dermatology PGY-2      This patient was seen, evaluated, and care plan was developed with the resident.  I agree with the assessment and plan as outlined in the resident's note.  I was also NOT present for the entire procedure.  Report electronically signed by Tama Gander, MD. Attending

## 2023-05-16 ENCOUNTER — Ambulatory Visit: Payer: BLUE CROSS/BLUE SHIELD | Admitting: Student in an Organized Health Care Education/Training Program

## 2023-06-04 NOTE — Progress Notes (Unsigned)
Visit Date: 06/05/2023, last seen on 11/28/2022 by Dr. Renaldo Reel    Follow up visit for Samantha Stokes (DOB: [birth year]), a 57yr old female  Chief complaint: skin check ***  History obtained from: Patient  Skin Cancer History: Melanoma in situ s/p Moh's 06/2021    ***  HX: ***  PE: ***  AP: *** Discussed histologic evaluation as gold standard of diagnosis vs ***, pt opts to proceed with ***    Dx: Irritated Seborrheic keratosis  HPI: Patient reports waxy spot with rough texture on skin present months, slightly red and scaly, Irritating  PE:  Waxy scaly stuck-on papule on the left forehead, erythematous base  Plan:   - Cryotherapy performed today.  LN2  x 1 left forehead  Verbal consent obtained.  (Risks discussed including but not limited to hyper- or hypo-pigmentation, scar, failure to resolve, possible need for additional procedure, pain, healing time, etc.)  Duration of freeze sufficient to achieve a slow thaw.        Dx: Seborrheic keratosis  HPI: Patient reports waxy spot with rough texture on skin present years, feels like getting bigger. Catches on her necklace  PE: Waxy stuck-on papule on the left chest  Assessment: chronic stable   Plan:   - The benign nature of these skin lesions was explained to the patient. No treatment is indicated at this time. The patient has been educated about self examination, and if changes occur including change in size,color or if lesions become symptomatic, the patient was instructed to return for follow up.   - Discussed that removal would be cosmetic, and be out-of-pocket cost. Pt defers at this time.      Dx: Photodamage  HPI: Patient presents increased tan of sun-exposed areas over the past several years. Asymptomatic, no prior treatment.  PE: Tanned skin over sun-exposed areas especially neck, arms, and legs  Assessment: new problem   Plan:   - Sun protection education was reviewed with the patient, including using a sunscreen with broadspectrum (UVA/UVB) protection with SPF of at  least 30 and above, with frequent re-application.       Dx: Solar lentigines; chronic stable  HPI: Patient presents dark spots on skin present years. Asymptomatic, no prior treatment.   PE: Multiple brown reticulate macules consistent with lentigines on the hands.   Plan:   - Broad spectrum sunscreen use encouraged and sunscreen handout provided. Discussed cosmetic treatment options.  - The benign nature of these skin lesions was explained. No treatment is indicated at this time.     Dx: Cherry angioma; chronic stable  HPI: Patient reports red spots on skin present for months-years. Asymptomatic, no prior treatment.  PE: Scattered brightly erythematous, vascular papules on the trunk and extremities.   Plan:   - The benign nature of these skin lesions was explained to the patient. No treatment is indicated at this time.        Scar. History of melanoma in situ of the left sole s/p Moh's in 06/2021  HX: No fever, chills, weight loss or fatigue. Site is well healed and asymptomatic. Denies difficulty breathing, chest pain, upset stomach, constipation, new cutaneous lesions or masses beneath the skin. Denies weight changes, night sweats.  PE: Well healed scar without evidence of recurrence. No axillary or cervical lymphadenopathy  AP: The nature of sun-induced photo-aging and skin cancers is discussed.  Sun avoidance, protective clothing, and the use of 30-SPF sunscreens is advised. Observe closely for skin damage/changes, and call if such occurs.  -  reviewed ABCDEs of melanoma    Return Visit: 6 months FBSE ***      Family History: There is unknown family history of melanoma skin cancer - patient was adopted.    Social History: The patient does not smoke or use tobacco products.  Occupation: IT sales professional    Meds:  List in EMR viewed today    Allergies:    List in EMR viewed today    Records Reviewed:   06/01/2021 Dermatology pathology report      SKIN, LEFT SOLE, (PUNCH  BIOPSY)               ATYPICAL JUNCTIONAL MELANOCYTIC PROLIFERATION, CONSISTENT WITH EARLY MELANOMA IN SITU, SEE NOTE.     Note: There are overlapping features with acral melanocytic nevus in this case, but the combined presence of moderate to severe atypia, single cell growth, and diffuse immunopositivity for PRAME (Preferentially expressed Antigen in MElanoma) favors early acral lentiginous melanoma in situ.     The tumor appears mostly removed but extends to peripheral biopsy edges. The biopsy base is uninvolved.     Melanoma in situ is classified as Stage 0 (pTis)     (AJCC 8th Ed.)     Areas Examined:  problem-focused ***      SCRIBE DISCLAIMER:  ***    ***

## 2023-06-05 ENCOUNTER — Ambulatory Visit: Payer: BLUE CROSS/BLUE SHIELD | Admitting: Dermatology

## 2023-06-05 ENCOUNTER — Ambulatory Visit: Payer: Self-pay | Admitting: Dermatology

## 2023-06-05 DIAGNOSIS — L814 Other melanin hyperpigmentation: Secondary | ICD-10-CM

## 2023-06-05 DIAGNOSIS — L905 Scar conditions and fibrosis of skin: Secondary | ICD-10-CM

## 2023-06-05 DIAGNOSIS — L821 Other seborrheic keratosis: Secondary | ICD-10-CM

## 2023-06-05 DIAGNOSIS — D1801 Hemangioma of skin and subcutaneous tissue: Secondary | ICD-10-CM

## 2023-06-05 DIAGNOSIS — Z85820 Personal history of malignant melanoma of skin: Secondary | ICD-10-CM

## 2023-06-05 DIAGNOSIS — Z86006 Personal history of melanoma in-situ: Secondary | ICD-10-CM

## 2023-06-05 NOTE — Nursing Note (Signed)
Pt's ID verified x 2: name/dob.  Allergies verified.  Pharmacy confirmed.       I verified with patient; it is okay to leave a detailed message regarding confidential medical information on [phone].      Myer Peer, Sr.LVN

## 2023-06-05 NOTE — Progress Notes (Signed)
--  COSMETIC VISIT--    Visit Date: 06/05/2023     Cosmetic visit for Samantha Stokes (DOB: 08/05/1966), a 57yr old female.    Chief complaint:  Cosmetic visit     Seborrheic keratosis  HX: Patient reports waxy spot with rough texture on skin present years, feels like getting bigger. Catches on her necklace  Interval 06/05/2023:  Patient reports waxy spot still present since last visit. Present for months. Seem to be growing in size and number. which is itchiness and bothersome, otherwise asymptomatic. Asymptomatic. No prior treatment.   PE: Waxy stuck-on papule on the left chest  AP: The benign nature of these skin lesions was explained to the patient. Offered cosmetic removal, informed patient of the out of pocket cost it may incur. Patient understands that this is an out-of-pocket expense not covered by insurance. Patient amenable. Consent signed, risks and side effects discussed including pain, scarring, infection, failure of proceduree to resolve condition, multiple treatments and hyper/hypopigmentation.  - Cosmetic contract reviewed and signed  - After obtaining verbal consent, 1 lesion(s) were treated with liquid nitrogen with a 10 second thaw time. The risks, including pain, blister formation, infection, hypo/hyperpigmentation, scar, recurrence, and the need for further treatment were discussed with the patient and informed consent was obtained verbally. The patient tolerated the treatment well and knows to return if the lesions do not resolve.     SCRIBE DISCLAIMER:  I, Solon Palm, a trained medical scribe, scribed this noted in the presence of Dr. Daphene Calamity, MD.   Electronically signed by Lynford Humphrey, 06/05/2023  5:32 PM      This patient was seen, evaluated, and care plan was developed with the resident.  I agree with the assessment and plan as outlined in the resident's note.  Report electronically signed by Daphene Calamity, MD. Attending

## 2023-12-06 ENCOUNTER — Ambulatory Visit: Payer: BLUE CROSS/BLUE SHIELD | Admitting: Dermatology

## 2023-12-06 DIAGNOSIS — L821 Other seborrheic keratosis: Secondary | ICD-10-CM

## 2023-12-06 DIAGNOSIS — Z85820 Personal history of malignant melanoma of skin: Secondary | ICD-10-CM

## 2023-12-06 DIAGNOSIS — D1801 Hemangioma of skin and subcutaneous tissue: Secondary | ICD-10-CM

## 2023-12-06 DIAGNOSIS — D2272 Melanocytic nevi of left lower limb, including hip: Secondary | ICD-10-CM

## 2023-12-06 DIAGNOSIS — L814 Other melanin hyperpigmentation: Secondary | ICD-10-CM

## 2023-12-06 DIAGNOSIS — L905 Scar conditions and fibrosis of skin: Secondary | ICD-10-CM

## 2023-12-06 DIAGNOSIS — Z86007 Personal history of in-situ neoplasm of skin: Secondary | ICD-10-CM

## 2023-12-06 DIAGNOSIS — D229 Melanocytic nevi, unspecified: Secondary | ICD-10-CM

## 2023-12-06 NOTE — Progress Notes (Signed)
 Visit Date: 12/06/2023, last seen on 06/05/2023 by Dr. Renaldo Reel    Follow up visit for Samantha Stokes (DOB: [birth year]), a 57yr old female  Chief complaint: spot on the L knee  History obtained from: Patient  Skin Cancer History: Melanoma in situ s/p Moh's 06/2021    Benign nevus  Concerned about a new freckle on her left knee. Asymptomatic, no acute changes, but seemingly new since last visit. She has been diligent with sun protection and has been using a sunscreen called Mad Hippie, which she finds effective and non-tacky.     PE: small (2mm), uniformly pigmented, with a clear border and normal reticular network.     AP:  - Clincially benign  - Reassurance  - Reviewed ABCDEs, photo protection, monthly self check  - Patient to call if changing or symptomatic    Seborrheic keratosis  HX: Patient reports waxy spot with rough texture on skin present years, feels like getting bigger. Catches on her necklace  Interval 06/05/2023:  Patient reports waxy spot on her left chest still present since last visit. Present for months. Seem to be growing in size, which is itchiness and bothersome, otherwise asymptomatic. No prior treatment.   Patient reports new waxy spot on her back   PE: Waxy stuck-on brown plaque on the left chest and back with pseudohorn cysts  AP: The benign nature of these skin lesions was explained to the patient. Offered cosmetic removal, informed patient of the out of pocket cost it may incur. Patient understands that this is an out-of-pocket expense not covered by insurance. Patient amenable. Consent signed, risks and side effects discussed including pain, scarring, infection, failure of procedure to resolve condition, multiple treatments and hyper/hypopigmentation.  - See accompanying cosmetic note dated today        Cherry angioma(s)  HX: Skin growth(s) located on the back. This was first noticed months to years ago. It is not growing in size. No bleeding, ulceration, pain or itch. No prior treatment or biopsy.  No known modifiers.  PE: erythematous, vascular papule(s) on the back  AP: Benign nature of these skin lesions was explained to the patient. No treatment is indicated at this time. The patient has been educated about self examination, and if changes occur including change in size,color or if lesions become symptomatic, the patient was instructed to return for follow up.     Solar lentigines  HX: Brown spots on the head for many months, not changing, not symptomatic, no treatment to date  PE: Reticulate hyperpigmented macules on the head  AP: Reassurance was given as to the benignity of the growths. No treatment medically necessary at this time. Offered cosmetic removal, informed patient of the out of pocket cost it may incur. Patient declines at this time.  - Sun protection education was reviewed with the patient, including using a Zinc-based sunscreen with broadspectrum protection with SPF of at least 30 and above, with frequent re-application.    Scar. History of melanoma in situ of the left sole s/p Moh's in 06/2021  HX: No fever, chills, weight loss or fatigue. Site is well healed and asymptomatic. Denies difficulty breathing, chest pain, upset stomach, constipation, new cutaneous lesions or masses beneath the skin. Denies weight changes, night sweats.  PE: Well healed scar without evidence of recurrence. No pre or post auricular, sub mental, cervical, supraclavicular, axillary, inguinal, or popliteal LAD    AP: The nature of sun-induced photo-aging and skin cancers is discussed.  Sun avoidance, protective clothing, and  the use of 30-SPF sunscreens is advised. Observe closely for skin damage/changes, and call if such occurs.  -reviewed ABCDEs of melanoma    Did not discuss  Irritated Seborrheic keratosis      Return Visit: 6 months FBSE, or sooner if needed       Family History: There is unknown family history of melanoma skin cancer - patient was  adopted.    Social History: The patient does not smoke or use tobacco products.  Occupation: IT sales professional    Meds:  List in EMR viewed today    Allergies:    List in EMR viewed today    Records Reviewed:   06/01/2021 Dermatology pathology report      SKIN, LEFT SOLE, (PUNCH BIOPSY)               ATYPICAL JUNCTIONAL MELANOCYTIC PROLIFERATION, CONSISTENT WITH EARLY MELANOMA IN SITU, SEE NOTE.     Note: There are overlapping features with acral melanocytic nevus in this case, but the combined presence of moderate to severe atypia, single cell growth, and diffuse immunopositivity for PRAME (Preferentially expressed Antigen in MElanoma) favors early acral lentiginous melanoma in situ.     The tumor appears mostly removed but extends to peripheral biopsy edges. The biopsy base is uninvolved.     Melanoma in situ is classified as Stage 0 (pTis)     (AJCC 8th Ed.)     Areas Examined:  full body       Samantha Calamity, MD    I spent a total of 30 minutes today on this patient's visit.       Today's visit has complexity inherent to evaluation and management associated with medical care services that are part of ongoing care related to a patient's single, serious condition or a complex condition, which is history of melanoma.    I obtained verbal consent from the patient to use AI ambient technology to transcribe the interactions between the patient and myself during the clinical encounter.

## 2023-12-06 NOTE — Nursing Note (Signed)
 Identified patient by name and DOB, screened for pain, mobility assessment, reviewed allergies and verified pharmacy.   Vitals not taken per provider's request.     Has patient fallen in the last 6 months:no  Does the patient used assistive devices such as a walker, wheelchair, or cane: no     I verified with patient;  It is okay to leave a detailed message regarding confidential medical information on phone number ( [phone] ).  The patient would like to be notified via MyChart as well.     I notified the patient that if the provider will be performing an exam under their undergarments, a chaperone will be offered during that exam.  The patient has declined a chaperone.  The provider was notified.     Alban Hudson, [place]

## 2023-12-07 NOTE — Progress Notes (Deleted)
 ASSESSMENT & PLAN       TIP  Review and verify diagnoses imported via AI Scribe are correct, matches what is in the Epic Visit Dx, and does not contradict other information in the note:15640  Assessment & Plan  History of Melanoma  No new skin issues. Noted a new freckle on the left knee, which is small (2mm), uniformly pigmented, with a clear border and normal reticular network. No signs of recurrence at the previous melanoma site.  -Continue routine skin checks every six months.  -Encourage monthly self skin checks.    General Health Maintenance  No fevers, chills, weight changes, difficulty breathing, chest pains, upset stomach, or constipation. Lymph nodes normal.  -Continue current health maintenance practices.  -Return in six months for routine follow-up.            SUBJECTIVE      Samantha Stokes is a 57yr old female.    History of Present Illness  The patient, with a history of melanoma, presents for a routine skin check. She reports no new skin issues and is particularly concerned about a new freckle on her left knee. She has been diligent with sun protection and has been using a sunscreen called Mad Hippie, which she finds effective and non-tacky. She also performs self skin checks approximately once a month.       OBJECTIVE      Her vitals were not taken for this visit.      TIP  Pertinent interpretation of Labs, Imaging, History, and/or Social Determinants related to medical decision making:15640    Physical Exam  SKIN: Left knee: 2 mm diameter, uniformly pigmented, with a clear border where it starts and ends, and a normal reticular network. Back: Normal moles, angiomas, and cherry angiomas. Front legs: Healthy appearance with normal moles and cherry angiomas. Bottom of feet: Scar from previous healing, no signs of recurrence. Hands: Nails and palms appear healthy. Forearms: Presence of freckles. Underarms: Normal moles and cherry angiomas. Face: Solar lentigines across the cheeks. Ears: Healthy  appearance. Tops of feet: Healthy appearance.       Results  PATHOLOGY  Biopsy: Benign nevus        TIP  Pertinent discussion and/or review from another source (a different division/facility/family member):15640    Total time I spent in care of this patient today (excluding time spent on other billable services) was *** minutes.     I obtained verbal consent from the patient to use AI ambient technology to transcribe the interactions between the patient and myself during the clinical encounter.

## 2024-06-12 ENCOUNTER — Ambulatory Visit: Payer: BLUE CROSS/BLUE SHIELD | Admitting: Dermatology

## 2024-06-24 NOTE — Progress Notes (Unsigned)
 Visit Date: 06/26/2024, last seen on 12/06/2023 by Dr. Josepha    Follow up visit for Samantha Stokes (DOB: 06/05/1966), a 58yr old female  Chief complaint: spot on the L knee ***   History obtained from: Patient  Skin Cancer History: Melanoma in situ s/p Moh's 06/2021    Benign nevus ***   Concerned about a new freckle on her left knee. Asymptomatic, no acute changes, but seemingly new since last visit. She has been diligent with sun protection and has been using a sunscreen called Mad Hippie, which she finds effective and non-tacky.   PE: small (2mm), uniformly pigmented, with a clear border and normal reticular network.   AP:  - Clincially benign  - Reassurance  - Reviewed ABCDEs, photo protection, monthly self check  - Patient to call if changing or symptomatic    Seborrheic keratosis ***   HX: Patient reports waxy spot with rough texture on skin present years, feels like getting bigger. Catches on her necklace  Interval 06/05/2023:  Patient reports waxy spot on her left chest still present since last visit. Present for months. Seem to be growing in size, which is itchiness and bothersome, otherwise asymptomatic. No prior treatment.   Patient reports new waxy spot on her back   PE: Waxy stuck-on brown plaque on the left chest and back with pseudohorn cysts  AP: The benign nature of these skin lesions was explained to the patient. Offered cosmetic removal, informed patient of the out of pocket cost it may incur. Patient understands that this is an out-of-pocket expense not covered by insurance. Patient amenable. Consent signed, risks and side effects discussed including pain, scarring, infection, failure of procedure to resolve condition, multiple treatments and hyper/hypopigmentation.  - See accompanying cosmetic note dated today    Cherry angioma(s) ***   HX: Skin growth(s) located on the back. This was first noticed months to years ago. It is not growing in size. No bleeding, ulceration, pain or itch. No prior treatment  or biopsy. No known modifiers.  PE: erythematous, vascular papule(s) on the back  AP: Benign nature of these skin lesions was explained to the patient. No treatment is indicated at this time. The patient has been educated about self examination, and if changes occur including change in size,color or if lesions become symptomatic, the patient was instructed to return for follow up.     Solar lentigines ***   HX: Brown spots on the head for many months, not changing, not symptomatic, no treatment to date  PE: Reticulate hyperpigmented macules on the head  AP: Reassurance was given as to the benignity of the growths. No treatment medically necessary at this time. Offered cosmetic removal, informed patient of the out of pocket cost it may incur. Patient declines at this time.  - Sun protection education was reviewed with the patient, including using a Zinc-based sunscreen with broadspectrum protection with SPF of at least 30 and above, with frequent re-application.    Scar. History of melanoma in situ of the left sole s/p Moh's in 06/2021  HX: No fever, chills, weight loss or fatigue. Site is well healed and asymptomatic. Denies difficulty breathing, chest pain, upset stomach, constipation, new cutaneous lesions or masses beneath the skin. Denies weight changes, night sweats.  PE: Well healed scar without evidence of recurrence. No pre or post auricular, sub mental, cervical, supraclavicular, axillary, inguinal, or popliteal LAD  AP: The nature of sun-induced photo-aging and skin cancers is discussed.  Sun avoidance, protective clothing, and  the use of 30-SPF sunscreens is advised. Observe closely for skin damage/changes, and call if such occurs.  -reviewed ABCDEs of melanoma    Did not discuss  Irritated Seborrheic keratosis ***     Return Visit: 6 months FBSE, or sooner if needed ***       Family History: There is unknown family history of melanoma skin cancer -  patient was adopted.    Social History: The patient does not smoke or use tobacco products.  Occupation: IT sales professional    Meds:  List in EMR viewed today    Allergies:    List in EMR viewed today    Records Reviewed:   06/01/2021 Dermatology pathology report ***       SKIN, LEFT SOLE, (PUNCH BIOPSY)               ATYPICAL JUNCTIONAL MELANOCYTIC PROLIFERATION, CONSISTENT WITH EARLY MELANOMA IN SITU, SEE NOTE.     Note: There are overlapping features with acral melanocytic nevus in this case, but the combined presence of moderate to severe atypia, single cell growth, and diffuse immunopositivity for PRAME (Preferentially expressed Antigen in MElanoma) favors early acral lentiginous melanoma in situ.     The tumor appears mostly removed but extends to peripheral biopsy edges. The biopsy base is uninvolved.     Melanoma in situ is classified as Stage 0 (pTis)     (AJCC 8th Ed.)     Areas Examined:  full body ***       ***    ***

## 2024-06-26 ENCOUNTER — Ambulatory Visit: Payer: Self-pay | Admitting: Dermatology

## 2024-06-26 DIAGNOSIS — D1801 Hemangioma of skin and subcutaneous tissue: Secondary | ICD-10-CM

## 2024-06-26 DIAGNOSIS — D2272 Melanocytic nevi of left lower limb, including hip: Secondary | ICD-10-CM

## 2024-06-26 DIAGNOSIS — D229 Melanocytic nevi, unspecified: Secondary | ICD-10-CM

## 2024-06-26 DIAGNOSIS — L814 Other melanin hyperpigmentation: Secondary | ICD-10-CM

## 2024-06-26 DIAGNOSIS — Z86006 Personal history of melanoma in-situ: Secondary | ICD-10-CM

## 2024-06-26 DIAGNOSIS — L905 Scar conditions and fibrosis of skin: Secondary | ICD-10-CM

## 2024-06-26 DIAGNOSIS — L821 Other seborrheic keratosis: Secondary | ICD-10-CM

## 2024-06-26 DIAGNOSIS — Z1283 Encounter for screening for malignant neoplasm of skin: Secondary | ICD-10-CM

## 2024-06-26 NOTE — Patient Instructions (Signed)
Sun Protection:    Why protect against the sun?  In the past, sun exposure was thought to be a healthy benefit of outdoor activity. However, studies have shown many unhealthy effects of sun exposure, such as early aging of the skin and skin cancer.    What kind of damage does sun exposure cause?  Part of the sun's energy that reaches earth is composed of rays of invisible ultraviolet (UV) light. When ultraviolet light rays (UVA and UVB) enter the skin, they damage skin cells, causing visible and invisible injuries.  Sunburn is a visible type of damage, which appears just a few hours after sun exposure. In many people, this type of damage also causes tanning. Freckles, which occur in people with fair skin, are usually due to sun exposure. Freckles are nearly always a sign that sun damage has occurred, and therefore show the need for sun protection.  Ultraviolet light rays also cause invisible damage to skin cells. Some of the injury is repaired, but some of the cell damage adds up year after year. After 20 to 30 years or more, the built-up damage appears as wrinkles, age spots, and even skin cancer. Although window glass blocks UVB light, UVA rays are able to penetrate through glass.    How can I protect from excessive sun exposure?  Avoidance. Stay away from the sun in the middle of the day.  Sun exposure is more intense closer to the equator, in the mountains, and in the summer. The sun's damaging effects are increased by reflection from water, white sand, and snow. Avoid long periods of direct sun exposure. Sit or play in the shade, especially when your shadow is shorter than you are tall.  Use sun protective clothing.  Cover up with light colored clothing when outdoors, including a hat to protect the scalp and face. In addition to filtering out the sun, tightly woven clothing reflects heat and helps keep you feeling cool. Sunglasses that block ultraviolet rays protect the eyes and eyelids. Multiple retailers  now sell sun protective clothing for adults and children.  Rash guards should be worn during outdoor swimming activity.  Block sun damage by applying a broad-spectrum UVA and UVB sunscreen with an SPF of 30 or higher and reapply approximately every two hours, even on cloudy days. If swimming or participating in intense physical activity, sunscreen may need to be applied more often.    Sunscreens: For every day use, SPF 15-30 preparations should be adequate and may be found in make up foundations and light lotions. I prefer the use of Zinc containing products such as CeraVe AM 30 or Olay complete 30.  Make sure to protect lips with lipscreens!  For any significant outdoor activity, please use a more water-resistant, more protective sunscreen. There are many excellent types available. I recommend SPF 30 or greater, containing Zinc oxide. Zinc makes the sunscreen more effective in blocking the longer wavelength UV rays. Titanium dioxide and Parsol also block longer wavelength rays, but not  quite as effectively in most preparations. Some products that I recommend include:  1. Elta MD (sold online)   2. Blue Lizard  3. Total Block 60  3. Vanicream 60 (zinc and titanium only) : available at several [facility]  4. Neutrogena Dry touch (no zinc) or Baby 60 (Zinc and titanium only)  5. Spectra 3  6. Coppertone (Sensitive skin and Water babies Pure and Simple, SPF 50)    Zinc sunscreens that are No-Nano (natural foods coop or online)    1. All Good spf 33  2. Badger

## 2024-06-26 NOTE — Progress Notes (Unsigned)
 Visit Date: 06/26/2024, last seen on 12/06/2023 by Dr. Josepha    Follow up visit for Samantha Stokes (DOB: October 08, 1966), a 58yr old female  Chief complaint: spot on the L knee, R calf   History obtained from: Patient  Skin Cancer History: Melanoma in situ s/p Moh's 06/2021    Benign nevus   Concerned about a new freckle on her left knee. Asymptomatic, no acute changes, but seemingly new since last visit. She has been diligent with sun protection and has been using a sunscreen called Mad Hippie, which she finds effective and non-tacky.   Interval 06/26/2024: Concerned about 2 freckles L leg. Asymptomatic, no acute change. States she does not enjoy using sunscreen, prefers to cover up.   PE: small (2mm), uniformly pigmented, with a clear border and normal reticular networkon L knee and L lateral calf.  AP:  - Clincially benign  - Reassurance  - Reviewed ABCDEs, photo protection, monthly self check  - Encouraged patient to use sun protective clothing when exercising outside in the sun.   - Patient to call if changing or symptomatic    Seborrheic keratosis    HX: Patient reports waxy spot with rough texture on skin present years, feels like getting bigger. Catches on her necklace  Interval 06/26/2024:  New lesion on R medial calf. Asymptomatic.  PE: Thin brown plaque on the R medial calf.   AP: The benign nature of these skin lesions was explained to the patient.    Cherry angioma(s)    HX: Skin growth(s) located on the back. This was first noticed months to years ago. It is not growing in size. No bleeding, ulceration, pain or itch. No prior treatment or biopsy. No known modifiers.  PE: erythematous, vascular papule(s) on the back  AP: Benign nature of these skin lesions was explained to the patient. No treatment is indicated at this time. The patient has been educated about self examination, and if changes occur including change in size,color or if lesions become symptomatic, the patient was instructed to return for follow up.      Solar lentigines    HX: Brown spots on the head for many months, not changing, not symptomatic, no treatment to date  PE: Reticulate hyperpigmented macules on the head  AP: Reassurance was given as to the benignity of the growths. No treatment medically necessary at this time. Offered cosmetic removal, informed patient of the out of pocket cost it may incur. Patient declines at this time.  - Sun protection education was reviewed with the patient, including using a Zinc-based sunscreen with broadspectrum protection with SPF of at least 30 and above, with frequent re-application.    Scar. History of melanoma in situ of the left sole s/p Moh's in 06/2021  HX: No fever, chills, weight loss or fatigue. Site is well healed and asymptomatic. Denies difficulty breathing, chest pain, upset stomach, constipation, new cutaneous lesions or masses beneath the skin. Denies weight changes, night sweats.  Interval 06/26/2024: ROS negative. Site is well healed and asymptomatic.   PE: Well healed scar without evidence of recurrence. No pre or post auricular, sub mental, cervical, supraclavicular, axillary, inguinal, or popliteal LAD  AP: The nature of sun-induced photo-aging and skin cancers is discussed.  Sun avoidance, protective clothing, and the use of 30-SPF sunscreens is advised. Observe closely for skin damage/changes, and call if such occurs.  -reviewed ABCDEs of melanoma  -Can space out FBSE to once every year.      Did not discuss  Irritated Seborrheic keratosis     Return Visit: 1 year FBSE, or sooner if needed       Family History: There is unknown family history of melanoma skin cancer - patient was adopted.    Social History: The patient does not smoke or use tobacco products.  Occupation: IT sales professional    Meds:  List in EMR viewed today    Allergies:    List in EMR viewed today    Records Reviewed:   06/01/2021 Dermatology pathology report        SKIN, LEFT SOLE,  (PUNCH BIOPSY)               ATYPICAL JUNCTIONAL MELANOCYTIC PROLIFERATION, CONSISTENT WITH EARLY MELANOMA IN SITU, SEE NOTE.     Note: There are overlapping features with acral melanocytic nevus in this case, but the combined presence of moderate to severe atypia, single cell growth, and diffuse immunopositivity for PRAME (Preferentially expressed Antigen in MElanoma) favors early acral lentiginous melanoma in situ.     The tumor appears mostly removed but extends to peripheral biopsy edges. The biopsy base is uninvolved.     Melanoma in situ is classified as Stage 0 (pTis)     (AJCC 8th Ed.)     Areas Examined:  full body      Patient was seen and staffed with Dr. Josepha. Their attestation will follow.     [name], MD  Dermatology, PGY-2

## 2024-06-26 NOTE — Nursing Note (Signed)
 Identified patient by name and DOB, screened for pain, mobility assessment, reviewed allergies and verified pharmacy.   Vitals not taken per provider's request.     Has patient fallen in the last 6 months:no  Does the patient used assistive devices such as a walker, wheelchair, or cane: no     I verified with patient;  It is okay to leave a detailed message regarding confidential medical information on phone number [phone] ).  The patient would like to be notified via MyChart as well.     I notified the patient that if the provider will be performing an exam under their undergarments, a chaperone will be offered during that exam.  The patient has declined a chaperone.  The provider was notified.     Mliss Birmingham, LVN

## 2025-01-20 ENCOUNTER — Ambulatory Visit

## 2025-01-20 VITALS — BP 148/79 | HR 74 | Temp 98.7°F | Resp 16 | Wt 130.5 lb

## 2025-01-20 DIAGNOSIS — H6902 Patulous Eustachian tube, left ear: Secondary | ICD-10-CM

## 2025-01-20 NOTE — Nursing Note (Signed)
 Patient verified by name and date of birth.   Vital signs assessed, allergies verified, and verified pharmacy.           Delrae Sawyers LVN

## 2025-06-30 ENCOUNTER — Ambulatory Visit: Admitting: Dermatology
